# Patient Record
Sex: Female | Born: 1975 | Race: Black or African American | Hispanic: No | Smoking: Never smoker
Health system: Southern US, Community
[De-identification: ages and names within clinical notes are randomized; demographics above are authoritative.]

## PROBLEM LIST (undated history)

## (undated) DIAGNOSIS — G43909 Migraine, unspecified, not intractable, without status migrainosus: Secondary | ICD-10-CM

## (undated) DIAGNOSIS — J45909 Unspecified asthma, uncomplicated: Secondary | ICD-10-CM

## (undated) DIAGNOSIS — D219 Benign neoplasm of connective and other soft tissue, unspecified: Secondary | ICD-10-CM

## (undated) DIAGNOSIS — R5382 Chronic fatigue, unspecified: Secondary | ICD-10-CM

## (undated) DIAGNOSIS — I10 Essential (primary) hypertension: Secondary | ICD-10-CM

## (undated) DIAGNOSIS — E039 Hypothyroidism, unspecified: Secondary | ICD-10-CM

## (undated) DIAGNOSIS — M549 Dorsalgia, unspecified: Secondary | ICD-10-CM

## (undated) DIAGNOSIS — K589 Irritable bowel syndrome without diarrhea: Secondary | ICD-10-CM

## (undated) DIAGNOSIS — G932 Benign intracranial hypertension: Secondary | ICD-10-CM

## (undated) DIAGNOSIS — Z862 Personal history of diseases of the blood and blood-forming organs and certain disorders involving the immune mechanism: Secondary | ICD-10-CM

## (undated) DIAGNOSIS — K219 Gastro-esophageal reflux disease without esophagitis: Secondary | ICD-10-CM

## (undated) DIAGNOSIS — R87619 Unspecified abnormal cytological findings in specimens from cervix uteri: Secondary | ICD-10-CM

## (undated) DIAGNOSIS — M1990 Unspecified osteoarthritis, unspecified site: Secondary | ICD-10-CM

## (undated) DIAGNOSIS — I82409 Acute embolism and thrombosis of unspecified deep veins of unspecified lower extremity: Secondary | ICD-10-CM

## (undated) DIAGNOSIS — D573 Sickle-cell trait: Secondary | ICD-10-CM

## (undated) DIAGNOSIS — IMO0002 Reserved for concepts with insufficient information to code with codable children: Secondary | ICD-10-CM

## (undated) DIAGNOSIS — Z9889 Other specified postprocedural states: Secondary | ICD-10-CM

## (undated) HISTORY — DX: Benign neoplasm of connective and other soft tissue, unspecified: D21.9

## (undated) HISTORY — PX: LEEP: SHX91

## (undated) HISTORY — DX: Migraine, unspecified, not intractable, without status migrainosus: G43.909

## (undated) HISTORY — PX: TOE SURGERY: SHX1073

## (undated) HISTORY — DX: Unspecified osteoarthritis, unspecified site: M19.90

## (undated) HISTORY — DX: Unspecified asthma, uncomplicated: J45.909

## (undated) HISTORY — DX: Irritable bowel syndrome, unspecified: K58.9

## (undated) HISTORY — DX: Gastro-esophageal reflux disease without esophagitis: K21.9

## (undated) HISTORY — DX: Benign intracranial hypertension: G93.2

## (undated) HISTORY — DX: Hypothyroidism, unspecified: E03.9

## (undated) HISTORY — DX: Dorsalgia, unspecified: M54.9

## (undated) HISTORY — DX: Reserved for concepts with insufficient information to code with codable children: IMO0002

## (undated) HISTORY — DX: Other specified postprocedural states: Z98.890

## (undated) HISTORY — DX: Unspecified abnormal cytological findings in specimens from cervix uteri: R87.619

## (undated) HISTORY — DX: Essential (primary) hypertension: I10

---

## 2000-05-12 ENCOUNTER — Ambulatory Visit (HOSPITAL_COMMUNITY): Admission: RE | Admit: 2000-05-12 | Discharge: 2000-05-12 | Payer: Self-pay | Admitting: Internal Medicine

## 2000-05-13 ENCOUNTER — Encounter: Payer: Self-pay | Admitting: Internal Medicine

## 2002-07-08 ENCOUNTER — Encounter: Payer: Self-pay | Admitting: Family Medicine

## 2002-07-08 ENCOUNTER — Ambulatory Visit (HOSPITAL_COMMUNITY): Admission: RE | Admit: 2002-07-08 | Discharge: 2002-07-08 | Payer: Self-pay | Admitting: Family Medicine

## 2002-12-08 ENCOUNTER — Encounter (HOSPITAL_COMMUNITY): Admission: RE | Admit: 2002-12-08 | Discharge: 2003-01-07 | Payer: Self-pay | Admitting: Internal Medicine

## 2002-12-08 ENCOUNTER — Encounter: Payer: Self-pay | Admitting: Internal Medicine

## 2002-12-24 ENCOUNTER — Encounter: Payer: Self-pay | Admitting: Internal Medicine

## 2002-12-24 ENCOUNTER — Ambulatory Visit (HOSPITAL_COMMUNITY): Admission: RE | Admit: 2002-12-24 | Discharge: 2002-12-24 | Payer: Self-pay | Admitting: Internal Medicine

## 2002-12-30 ENCOUNTER — Encounter: Payer: Self-pay | Admitting: Internal Medicine

## 2003-11-04 ENCOUNTER — Ambulatory Visit (HOSPITAL_COMMUNITY): Admission: RE | Admit: 2003-11-04 | Discharge: 2003-11-04 | Payer: Self-pay | Admitting: Family Medicine

## 2003-12-15 ENCOUNTER — Ambulatory Visit (HOSPITAL_COMMUNITY): Admission: RE | Admit: 2003-12-15 | Discharge: 2003-12-15 | Payer: Self-pay | Admitting: Orthopedic Surgery

## 2004-02-17 ENCOUNTER — Ambulatory Visit (HOSPITAL_COMMUNITY): Admission: RE | Admit: 2004-02-17 | Discharge: 2004-02-17 | Payer: Self-pay | Admitting: Internal Medicine

## 2004-05-09 ENCOUNTER — Ambulatory Visit (HOSPITAL_COMMUNITY): Admission: RE | Admit: 2004-05-09 | Discharge: 2004-05-09 | Payer: Self-pay | Admitting: Internal Medicine

## 2004-10-03 ENCOUNTER — Ambulatory Visit (HOSPITAL_COMMUNITY): Admission: RE | Admit: 2004-10-03 | Discharge: 2004-10-03 | Payer: Self-pay | Admitting: Internal Medicine

## 2004-11-01 ENCOUNTER — Ambulatory Visit: Payer: Self-pay | Admitting: Orthopedic Surgery

## 2004-11-12 ENCOUNTER — Ambulatory Visit: Payer: Self-pay | Admitting: Orthopedic Surgery

## 2004-11-13 ENCOUNTER — Encounter (HOSPITAL_COMMUNITY): Admission: RE | Admit: 2004-11-13 | Discharge: 2004-12-13 | Payer: Self-pay | Admitting: Orthopedic Surgery

## 2004-12-03 ENCOUNTER — Ambulatory Visit: Payer: Self-pay | Admitting: Orthopedic Surgery

## 2005-05-12 ENCOUNTER — Emergency Department (HOSPITAL_COMMUNITY): Admission: EM | Admit: 2005-05-12 | Discharge: 2005-05-13 | Payer: Self-pay | Admitting: Emergency Medicine

## 2006-02-21 ENCOUNTER — Ambulatory Visit (HOSPITAL_COMMUNITY): Admission: RE | Admit: 2006-02-21 | Discharge: 2006-02-21 | Payer: Self-pay | Admitting: Internal Medicine

## 2008-11-05 ENCOUNTER — Emergency Department (HOSPITAL_COMMUNITY): Admission: EM | Admit: 2008-11-05 | Discharge: 2008-11-05 | Payer: Self-pay | Admitting: Emergency Medicine

## 2008-12-15 ENCOUNTER — Emergency Department (HOSPITAL_COMMUNITY): Admission: EM | Admit: 2008-12-15 | Discharge: 2008-12-15 | Payer: Self-pay | Admitting: Emergency Medicine

## 2008-12-19 ENCOUNTER — Emergency Department (HOSPITAL_COMMUNITY): Admission: EM | Admit: 2008-12-19 | Discharge: 2008-12-19 | Payer: Self-pay | Admitting: Emergency Medicine

## 2009-10-12 ENCOUNTER — Emergency Department (HOSPITAL_COMMUNITY): Admission: EM | Admit: 2009-10-12 | Discharge: 2009-10-12 | Payer: Self-pay | Admitting: Emergency Medicine

## 2010-04-11 ENCOUNTER — Encounter (INDEPENDENT_AMBULATORY_CARE_PROVIDER_SITE_OTHER): Payer: Self-pay | Admitting: *Deleted

## 2010-05-03 ENCOUNTER — Ambulatory Visit
Admission: RE | Admit: 2010-05-03 | Discharge: 2010-05-03 | Payer: Self-pay | Source: Home / Self Care | Attending: Gastroenterology | Admitting: Gastroenterology

## 2010-05-03 DIAGNOSIS — K219 Gastro-esophageal reflux disease without esophagitis: Secondary | ICD-10-CM | POA: Insufficient documentation

## 2010-05-03 DIAGNOSIS — K5900 Constipation, unspecified: Secondary | ICD-10-CM | POA: Insufficient documentation

## 2010-05-08 ENCOUNTER — Encounter: Payer: Self-pay | Admitting: Internal Medicine

## 2010-05-15 ENCOUNTER — Encounter: Payer: Self-pay | Admitting: Gastroenterology

## 2010-05-18 ENCOUNTER — Encounter (INDEPENDENT_AMBULATORY_CARE_PROVIDER_SITE_OTHER): Payer: Self-pay

## 2010-05-18 LAB — CONVERTED CEMR LAB
Eosinophils Relative: 1 % (ref 0–5)
HCT: 35.7 % — ABNORMAL LOW (ref 36.0–46.0)
Hemoglobin: 12.2 g/dL (ref 12.0–15.0)
Lymphocytes Relative: 33 % (ref 12–46)
Lymphs Abs: 2.7 10*3/uL (ref 0.7–4.0)
Monocytes Absolute: 0.6 10*3/uL (ref 0.1–1.0)
Monocytes Relative: 8 % (ref 3–12)
RBC: 4.26 M/uL (ref 3.87–5.11)
WBC: 8.2 10*3/uL (ref 4.0–10.5)

## 2010-05-22 ENCOUNTER — Encounter: Payer: Self-pay | Admitting: Internal Medicine

## 2010-05-31 NOTE — Letter (Signed)
Summary: Normal Results Letter  [facility]  [address]   [place], [place] [address]   Phone: [phone]  Fax: [phone]    May 18, 2010  Sara Pruitt [address] [place], [place]  [address] 1976-01-30   Dear Ms. Bowdoin,   Our office has been trying to contact you.  We just wanted to inform you that all of your labs were normal. Gerrit Halls NP would like for you to schedule a colonoscopy, please call the office at [phone] to schedule.   Thank you,    Hendricks Limes, LPN Cloria Spring, LPN  [facility] Ph: [phone]   Fax: [phone]

## 2010-05-31 NOTE — Letter (Signed)
Summary: Unable to Reach, Consult Scheduled  [facility]  [address]   [place], [place] [address]   Phone: [phone]  Fax: [phone]    04/11/2010  Sara Pruitt [address] [place], [place]  [address] [birth year]   Dear [name],   At the recommendation of DR [facility]  we have been asked to schedule you a consult with DR FIELDS for CHRONIC CONSTIPATION WITH SEVERE HALITOSIS. Please call our office at [phone].     Thank you,    Diana Eves  [facility] R. Roetta Sessions, M.D.    Jonette Eva, M.D. Lorenza Burton, FNP-BC    Tana Coast, PA-C Phone: [phone]    Fax: [phone]

## 2010-05-31 NOTE — Letter (Signed)
Summary: TCS ORDER  TCS ORDER   Imported By: Ave Filter 05/22/2010 11:28:22  _____________________________________________________________________  External Attachment:    Type:   Image     Comment:   External Document

## 2010-05-31 NOTE — Letter (Signed)
Summary: REFERRAL FROM [facility] MEDICAL  REFERRAL FROM [facility] MEDICAL   Imported By: Rexene Alberts 05/08/2010 14:19:47  _____________________________________________________________________  External Attachment:    Type:   Image     Comment:   External Document

## 2010-05-31 NOTE — Assessment & Plan Note (Signed)
Summary: chronic constipation with severe halitosis/ss   Visit Type:  Initial Consult Referring Provider:  Sherwood Gambler Primary Care Provider:  Sherwood Gambler  CC:  constipation.  History of Present Illness: Sara Pruitt is a pleasant 35 year old African American female who presents today for evaluation for chronic constipation. Also reports has been told she has bad breath by her mom and son. Diagnosed with IBS-C approximately 3 years ago by PCP. Denies abdominal pain, denies N/V. +straining with BM. Reports averaging about 2 BMs per month. Went 3 mos without going to bathroom prior to New Years. Drinks at least 6-8 glasses of water per day. Has not seen brbpr, but does report burgundy looking stool at times. No fiber supplements. Doesn't take anything for constipation. Had been taking mineral oil at one point, but stopped. Doesn't like having to take anything. Does report occasional hard stools. Reports reflux, X 10 years, since child born, associated with wt gain. Had been in size  3 or 4 prior to pregnancy. Now weighs 197. Reports steadily gaining weight. No loss of appetite. First-degree relative with hx of colon ca age 74 (mom). No prior colonoscopy.  Current Medications (verified): 1)  Mirena 20 Mcg/24hr Iud (Levonorgestrel) .... As Directed 2)  Lisinopril 10 Mg Tabs (Lisinopril) .... Take 1 Tablet By Mouth Once A Day 3)  Multi-Vitamin .... One Tablet Daily  Allergies (verified): 1)  ! 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  Alcohol Use - no Illicit Drug Use - no Smoking  Status:  never Drug Use:  no  Review of Systems General:  Denies fever, chills, and anorexia. Eyes:  Denies blurring, irritation, and discharge. ENT:  Denies sore throat, hoarseness, and difficulty swallowing. CV:  Denies chest pains, syncope, and dyspnea on exertion. Resp:  Denies dyspnea at rest, cough, and wheezing. GI:  Complains of indigestion/heartburn and constipation; denies difficulty swallowing, pain on swallowing, nausea, vomiting, abdominal pain, gas/bloating, change in bowel habits, black BMs, and fecal incontinence. GU:  Denies urinary burning and urinary frequency. MS:  Denies joint pain / LOM, joint swelling, and joint stiffness. Derm:  Denies rash, itching, and dry skin. Neuro:  Denies weakness and syncope. Psych:  Denies depression and anxiety. Endo:  Denies cold intolerance and heat intolerance. Heme:  Denies bruising and bleeding.  Vital Signs:  Patient profile:   35 year old female Height:      67 inches Weight:      197 pounds BMI:     30.97 Temp:     98.9 degrees F oral Pulse rate:   60 / minute BP sitting:   120 / 90  (left arm) Cuff size:   large  Vitals Entered By: Sara Spring LPN (May 03, 2010 2:14 PM)  Physical Exam  General:  Well developed, well nourished, no acute distress.obese.   Head:  Normocephalic and atraumatic. Eyes:  sclera without icterus Mouth:  No deformity or lesions, dentition normal. Neck:  Supple; no masses or thyromegaly. Lungs:  Clear throughout to auscultation. Heart:  Regular rate and rhythm; no murmurs, rubs,  or bruits. Abdomen:  normal bowel sounds, obese,  without guarding, without rebound, no distesion, no tenderness, no masses, and no hepatomegally or splenomegaly.   Msk:  Symmetrical with no gross deformities. Normal posture. Pulses:  Normal pulses noted. Extremities:  No clubbing, cyanosis, edema or deformities noted. Neurologic:  Alert and  oriented x4;  grossly normal neurologically. Skin:  Intact without  significant lesions or rashes. Psych:  Alert and cooperative. Normal mood and affect.  Impression & Recommendations:  Problem # 1:  CONSTIPATION (ICD-40.1) 35 year old African-American female with hx of chronic constipation, usually averaging 2 stools/month. Prior to New Years, reportedly had not had BM X 3 mos. Denies abdominal pain, N/V. Does report hx of intermittent burgundy stools, mom diagnosed with colon ca age 58. Pt does not take any fiber supplements or agents for constipation. No recent labs available. Doubt malignant process, but due to family hx and reports of burgundy-colored stools, would benefit from screening colonoscopy. In the interim, will start on bowel regimen and assess for anemia, endocrine factors. If prescribed bowel regimen does not alleviate constipation and nothing is found on colonoscopy, pt may likely benefit from [place].  CBC, TSH Fiber of choice daily Probiotic daily Miralax daily as needed constipation  colace twice/day. TCS with Dr. Jena Gauss in near future: the risks, benefits, and alternatives have been discussed in detail with the pt; verbal consent obtained.  Orders: T-CBC w/Diff ([id number]) T-TSH [id number]) Consultation Level III (40102)  Problem # 2:  GERD (ICD-530.81)  hx of GERD since son was born, (11 years). Has had steady weight gain since then, likely a conributing factor to symptoms. Not currently on PPI, no warning signs or red flags.  Wt loss recommended (1-2 lbs per week) Prilosec 20 mg by mouth daily  Orders: Consultation Level III (72536)  Patient Instructions: 1)  Fiber supplement daily (Metamucil, Benefiber) 2)  Colace twice a day, hold if diarrhea 3)  Miralax daily for constipation, hold if diarrhea 4)  Probiotic daily (Align, Restora) 5)  Will discuss need for colonoscopy  6)  Prilosec daily, 30 minutes before meals 7)  The medication list was reviewed and reconciled.  All changed / newly prescribed medications were  explained.  A complete medication list was provided to the patient / caregiver.  Prescriptions: PRILOSEC 20 MG CPDR (OMEPRAZOLE) take 1 30 minutes before breakfast by mouth daily  #31 x 3   Entered and Authorized by:   Gerrit Halls NP   Signed by:   Gerrit Halls NP on 05/03/2010   Method used:   Faxed to ...       [facility] (retail)       [address]       [place], [place]  [address]       Ph: [phone]       Fax: [phone]   RxID:   [id number]

## 2010-06-05 ENCOUNTER — Ambulatory Visit (HOSPITAL_COMMUNITY)
Admission: RE | Admit: 2010-06-05 | Discharge: 2010-06-05 | Disposition: A | Discharge status: Home / Self Care | Payer: Medicaid Other | Source: Ambulatory Visit | Attending: Internal Medicine | Admitting: Internal Medicine

## 2010-06-05 ENCOUNTER — Encounter: Payer: Medicaid Other | Admitting: Internal Medicine

## 2010-06-05 DIAGNOSIS — K5900 Constipation, unspecified: Secondary | ICD-10-CM

## 2010-06-05 DIAGNOSIS — Z79899 Other long term (current) drug therapy: Secondary | ICD-10-CM | POA: Insufficient documentation

## 2010-06-05 DIAGNOSIS — K5909 Other constipation: Secondary | ICD-10-CM | POA: Insufficient documentation

## 2010-06-05 DIAGNOSIS — Z800 Family history of malignant neoplasm of digestive organs: Secondary | ICD-10-CM | POA: Insufficient documentation

## 2010-06-05 DIAGNOSIS — I10 Essential (primary) hypertension: Secondary | ICD-10-CM | POA: Insufficient documentation

## 2010-06-07 DIAGNOSIS — Z9889 Other specified postprocedural states: Secondary | ICD-10-CM

## 2010-06-07 HISTORY — DX: Other specified postprocedural states: Z98.890

## 2010-06-19 NOTE — Op Note (Signed)
  NAME:  Sara Pruitt, Sara Pruitt          ACCOUNT NO.:  [id number]  MEDICAL RECORD NO.:  [id number]           PATIENT TYPE:  O  LOCATION:  [facility]YP                          FACILITY:  [facility]  PHYSICIAN:  R. Roetta Sessions, M.D. DATE OF BIRTH:  08-04-1975  DATE OF PROCEDURE:  06/05/2010 DATE OF DISCHARGE:                              OPERATIVE REPORT   PROCEDURE:  Ileal colonoscopy.  INDICATIONS FOR PROCEDURE:  A 35 year old lady with significant refractory chronic constipation.  No improvement with MiraLax recently, mangled a month for she has a bowel movement, positive family history colon cancer, first-degree relative at a young age.  Colonoscopy is now being done.  Risks, benefits, limitations, alternatives, imponderables have been discussed, questions answered.  Please see the documentation in the medical record.  PROCEDURE NOTE:  O2 saturation, blood pressure, pulse, respirations were monitored throughout the entirety of the procedure. CONSCIOUS SEDATION:  Versed 4 mg IV, Demerol 75 mg IV in divided doses.  INSTRUMENT:  Pentax video chip system.  FINDINGS:  Digital rectal exam revealed no abnormalities.  Endoscopic findings:  Prep was adequate.  Colon:  Colonic mucosa was surveyed from the rectosigmoid junction through the left transverse right colon to the appendiceal orifice, ileocecal valve/cecum.  These structures were well seen and photographed for the record.  Terminal ileum was intubated to 5 cm.  From this level, scope was slowly and cautiously withdrawn.  All previously mentioned mucosal surfaces were again seen.  The patient had somewhat of an elongated tortuous colon, otherwise colonic mucosa appeared normal as did terminal ileal mucosa.  Scope was pulled down to the rectum where a thorough examination of rectal mucosa including retroflexed view of the anal verge demonstrated no abnormalities.  The patient tolerated the procedure well.  Cecal withdrawal time 9  minutes.  IMPRESSION:  Normal rectum, colon, and terminal ileum.  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  RECOMMENDATIONS: 1. We will proceed with the course of Amitiza 8 mcg gelcap, beginning     with one gelcap during the breakfast daily for 5 days, and increase     to 8 mcg twice daily during meals, i.e. breakfast and supper.     Thereafter, the     patient was admonished about adequate birth control and avoiding     pregnancy on this regimen.  Also she was admonished about the side     effects including headache, nausea, and diarrhea. 2. We will arrange followup appointment with [place] in the office in 1     month.     Jonathon Bellows, M.D.     RMR/MEDQ  D:  06/05/2010  T:  06/05/2010  Job:  [id number]  cc:   Madelin Rear. Sherwood Gambler, MD Fax: [phone]  Electronically Signed by Lorrin Goodell M.D. on 06/19/2010 02:32:53 PM

## 2010-06-29 ENCOUNTER — Encounter: Payer: Self-pay | Admitting: Gastroenterology

## 2010-07-03 ENCOUNTER — Ambulatory Visit (INDEPENDENT_AMBULATORY_CARE_PROVIDER_SITE_OTHER): Payer: Medicaid Other | Admitting: Gastroenterology

## 2010-07-03 ENCOUNTER — Encounter: Payer: Self-pay | Admitting: Gastroenterology

## 2010-07-03 DIAGNOSIS — K219 Gastro-esophageal reflux disease without esophagitis: Secondary | ICD-10-CM

## 2010-07-03 DIAGNOSIS — K5900 Constipation, unspecified: Secondary | ICD-10-CM

## 2010-07-10 NOTE — Assessment & Plan Note (Signed)
Summary: 4 WK F/U/LAW   Vital Signs:  Patient profile:   35 year old female Height:      69 inches Weight:      200.75 pounds BMI:     29.75 Temp:     98.2 degrees F oral Pulse rate:   60 / minute BP sitting:   126 / 86  (left arm)  Vitals Entered By: Carolan Clines LPN (July 02, 452 10:29 AM)  Visit Type:  Follow-up Visit Primary Care Provider:  Sherwood Gambler   History of Present Illness: Sara Pruitt is a 35 year old African-American female who presents today in f/u for constipation. Was started on Amitiza twice/day after colonoscopy 06/05/10. .Taking fiber twice/day. Occasional cramping prior to BM, immediately relieved.  Has a BM at least once every day now. No melena or brbpr. Not taking Prilosec every day, no difficulty with reflux. States bad breath is better. Less bloating. Trying to eat yogurt.    2/7/12Normal rectum, colon, and terminal ileum.  I suspect functional constipation,   Current Medications (verified): 1)  Mirena 20 Mcg/24hr Iud (Levonorgestrel) .... As Directed 2)  Amitiza 8 Mcg Caps (Lubiprostone) .... Take One At Breakfast and One At Supper 3)  Benefiber  Powd (Wheat Dextrin) .... Take Two Times A Day  Allergies: 1)  ! 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 Eyes:  Denies blurring, irritation, and discharge. ENT:  Denies sore throat, hoarseness, and difficulty swallowing. CV:  Denies chest pains and syncope. Resp:  Denies dyspnea at rest and wheezing. GI:  See HPI. GU:  Denies urinary burning and urinary frequency. MS:  Denies joint pain / LOM, joint swelling, and joint stiffness. Derm:  Denies rash, itching, and dry skin. Neuro:  Denies weakness and syncope. Psych:  Denies depression and anxiety.  Physical Exam  General:  Well developed, well nourished, no acute distress. Eyes:  PERRLA, no  icterus. Abdomen:  +BS, soft, non-tender, non-distended. no HSM, no rebound or guarding.  Msk:  Symmetrical with no gross deformities. Normal posture. Neurologic:  Alert and  oriented x4;  grossly normal neurologically. Skin:  Intact without significant lesions or rashes. Psych:  Alert and cooperative. Normal mood and affect.   Impression & Recommendations:  Problem # 1:  CONSTIPATION (ICD-31.32)  35 year old female s/p colonoscopy 05/2010: functional constipation. Started on Amitiza, doing well. Fiber daily. No melena or brbpr.   Continue Amitiza two times a day Continue fiber Start probiotic: samples given Return in 6 mos  Orders: Est. Patient Level II (09811)  Problem # 2:  GERD (ICD-530.81)  No issues currently. Takes as needed. No changes   Return in 6 mos  Orders: Est. Patient Level II (91478) Prescriptions: AMITIZA 8 MCG CAPS (LUBIPROSTONE) take 1 by mouth with meal in morning, 1 by mouth with meal in evening. avoid pregnancy  #62 x 5   Entered and Authorized by:   Gerrit Halls NP   Signed by:   Gerrit Halls NP on 07/03/2010   Method used:   Faxed to ...       [facility] (retail)       [address]       [place], [place]  [address]       Ph: [phone]       Fax: [phone]   RxID:  [id number]    Orders Added: 1)  Est. Patient Level II [96295]  Appended Document: 4 WK F/U/LAW 6 MONTH F/U OPV IS IN THE COMPUTER

## 2010-07-15 LAB — BASIC METABOLIC PANEL
BUN: 7 mg/dL (ref 6–23)
Chloride: 106 mEq/L (ref 96–112)
Glucose, Bld: 92 mg/dL (ref 70–99)
Potassium: 3.5 mEq/L (ref 3.5–5.1)

## 2010-07-20 ENCOUNTER — Emergency Department (HOSPITAL_COMMUNITY): Payer: Medicaid Other

## 2010-07-20 ENCOUNTER — Emergency Department (HOSPITAL_COMMUNITY)
Admission: EM | Admit: 2010-07-20 | Discharge: 2010-07-20 | Disposition: A | Discharge status: Home / Self Care | Payer: Medicaid Other | Attending: Emergency Medicine | Admitting: Emergency Medicine

## 2010-07-20 DIAGNOSIS — R110 Nausea: Secondary | ICD-10-CM | POA: Insufficient documentation

## 2010-07-20 DIAGNOSIS — R109 Unspecified abdominal pain: Secondary | ICD-10-CM | POA: Insufficient documentation

## 2010-07-20 DIAGNOSIS — K560 Paralytic ileus: Secondary | ICD-10-CM | POA: Insufficient documentation

## 2010-07-20 DIAGNOSIS — R197 Diarrhea, unspecified: Secondary | ICD-10-CM | POA: Insufficient documentation

## 2010-07-20 LAB — COMPREHENSIVE METABOLIC PANEL
Albumin: 3.8 g/dL (ref 3.5–5.2)
BUN: 11 mg/dL (ref 6–23)
Chloride: 106 mEq/L (ref 96–112)
Creatinine, Ser: 0.92 mg/dL (ref 0.4–1.2)
Glucose, Bld: 107 mg/dL — ABNORMAL HIGH (ref 70–99)
Total Bilirubin: 0.4 mg/dL (ref 0.3–1.2)

## 2010-07-20 LAB — URINALYSIS, ROUTINE W REFLEX MICROSCOPIC
Bilirubin Urine: NEGATIVE
Ketones, ur: NEGATIVE mg/dL
Leukocytes, UA: NEGATIVE
Nitrite: NEGATIVE
Protein, ur: NEGATIVE mg/dL
Urobilinogen, UA: 0.2 mg/dL (ref 0.0–1.0)
pH: 5.5 (ref 5.0–8.0)

## 2010-07-20 LAB — DIFFERENTIAL
Eosinophils Absolute: 0 10*3/uL (ref 0.0–0.7)
Eosinophils Relative: 0 % (ref 0–5)
Lymphs Abs: 1.5 10*3/uL (ref 0.7–4.0)
Monocytes Relative: 5 % (ref 3–12)

## 2010-07-20 LAB — CBC
HCT: 38.9 % (ref 36.0–46.0)
MCH: 29.6 pg (ref 26.0–34.0)
MCV: 84 fL (ref 78.0–100.0)
Platelets: 300 10*3/uL (ref 150–400)
RBC: 4.63 MIL/uL (ref 3.87–5.11)
RDW: 13.2 % (ref 11.5–15.5)

## 2010-07-20 LAB — PREGNANCY, URINE: Preg Test, Ur: NEGATIVE

## 2010-07-20 LAB — LIPASE, BLOOD: Lipase: 28 U/L (ref 11–59)

## 2010-07-26 ENCOUNTER — Encounter: Payer: Self-pay | Admitting: Gastroenterology

## 2010-07-30 ENCOUNTER — Encounter: Payer: Self-pay | Admitting: Gastroenterology

## 2010-07-31 ENCOUNTER — Encounter: Payer: Self-pay | Admitting: Gastroenterology

## 2010-07-31 ENCOUNTER — Ambulatory Visit (INDEPENDENT_AMBULATORY_CARE_PROVIDER_SITE_OTHER): Payer: Medicaid Other | Admitting: Gastroenterology

## 2010-07-31 VITALS — BP 138/91 | HR 66 | Temp 98.2°F | Ht 67.0 in | Wt 195.5 lb

## 2010-07-31 DIAGNOSIS — K5900 Constipation, unspecified: Secondary | ICD-10-CM

## 2010-07-31 NOTE — Patient Instructions (Signed)
1. We are increasing the Amitiza dose. Watch for headaches, nausea, abdominal pain. Begin by taking Amitiza 24 mcg each evening. Increase to Amitiza 24 mcg in morning and at night after 3 days. If stools become loose, call our office. We will need to adjust the dose. USE birth control while on this medicine.   2. Supplemental fiber daily.  3. Begin probiotic.  4. We will see you back in 6 weeks.

## 2010-08-02 ENCOUNTER — Encounter: Payer: Self-pay | Admitting: Gastroenterology

## 2010-08-02 NOTE — Progress Notes (Signed)
Referring Provider: Cassell Smiles., MD Primary Care Physician:  Cassell Smiles., MD  Chief Complaint  Patient presents with  . Abdominal Pain    recent er visit    HPI:   Sara Pruitt is a pleasant 35 year old female with hx of chronic constipation; recently underwent colonoscopy in February that was normal. Actually presented to ED March 23rd secondary to abdominal pain, +nausea. AAS was performed with findings of questionable focal ileus. Pt states she was bloated from epigastrum to umbilicus; pain 10/10, felt like labor. After leaving, finally able to have BM. Had large amount. Felt relief. Denies N/V currently. Only complaint is of bloating. Denies abdominal pain. +flatus. No signs of obstruction. Afebrile. On Amitiza 8 mcg bid. No probiotic.   Past Medical History  Diagnosis Date  . Constipation     ? IBS  . GERD (gastroesophageal reflux disease)   . HTN (hypertension)   . S/P colonoscopy June 07, 2010    Dr. Jena Gauss: secondary to constipation, normal. 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 levonorgestrel (MIRENA) 20 MCG/24HR IUD 1 each by Intrauterine route once.        Marland Kitchen lisinopril (PRINIVIL,ZESTRIL) 10 MG tablet Take 10 mg by mouth daily.        Marland Kitchen lubiprostone (AMITIZA) 8 MCG capsule Take 8 mcg by mouth. Take one at breakfast and one at supper       . Multiple Vitamin (MULTIVITAMIN PO) Take by mouth daily.        Marland Kitchen omeprazole (PRILOSEC) 20 MG capsule Take 20 mg by mouth daily. TAKE ONE 30 MINUTES BEFORE BREAKFAST BY MOUTH DAILY       . Wheat Dextrin (BENEFIBER) POWD Take by mouth. Take two times a day         Allergies as of 07/31/2010 - Review Complete 07/31/2010  Allergen Reaction Noted  . Sulfonamide derivatives     Review of Systems: Gen: Denies fever, chills, anorexia. Denies fatigue, weakness, weight loss.  CV:  Denies chest pain, palpitations, syncope, peripheral edema, and claudication. Resp: Denies dyspnea at rest, cough, wheezing, coughing up blood, and pleurisy. GI: See HPI Derm: Denies rash, itching, dry skin Psych: Denies depression, anxiety, memory loss, confusion. No homicidal or suicidal ideation.  Heme: Denies bruising, bleeding, and enlarged lymph nodes.  Physical Exam: BP 138/91  Pulse 66  Temp 98.2 F (36.8 C)  Ht 5\' 7"  (1.702 m)  Wt 195 lb 8 oz (88.678 kg)  BMI 30.62 kg/m2  SpO2 100% General:   Alert and oriented. No distress noted. Pleasant and cooperative.  Head:  Normocephalic and atraumatic. Eyes:  Conjuctiva clear without scleral icterus. Mouth:  Oral mucosa pink and moist. Good dentition. No lesions. Neck:  Supple, without mass or thyromegaly. Heart:  S1, S2 present without murmurs, rubs, or gallops. Regular rate and rhythm. Abdomen:  +BS, soft, non-tender and non-distended. No rebound or guarding. No HSM or masses noted. Msk:  Symmetrical without gross deformities. Normal posture. Extremities:  Without edema. Neurologic:  Alert and  oriented x4;  grossly normal neurologically. Skin:  Intact without significant lesions or rashes. Psych:  Alert and cooperative. Normal mood and affect.

## 2010-08-02 NOTE — Assessment & Plan Note (Signed)
35 year old pleasant female with hx of chronic constipation. Colonoscopy in 05/2010 reassuring. Late March presented to ED due to severe constipation. AAS findings with questionable focal ileus. Pt presents today with no signs of obstruction, no N/V. Only mild bloating. On Amitiza twice/day. May benefit from increasing dosage slowly, add probiotic, continue benefiber. Pt counseled heavily on signs/symptoms to watch for concerning Amitiza.   Amitiza 24 mcg, take one daily X 3 days, then twice/day. Watch for N/V, abdominal pain, abdominal distension. AVOID pregnancy. Use birth control methods. Samples provided. Pt to call is this assists with chronic constipation.   Continue benefiber  Probiotic daily; samples provided.  Return in 6 weeks for reassessment.

## 2010-08-06 ENCOUNTER — Telehealth: Payer: Self-pay

## 2010-08-06 NOTE — Telephone Encounter (Signed)
If pt is taking BID, decrease to once per day. If she is already taking only once/day, return back to original dosing of BID. Needs to ensure taking a probiotic daily, benefiber daily. Drink 6-8 glasses of water daily. Exercise most days of the week.

## 2010-08-06 NOTE — Telephone Encounter (Signed)
Tried to call pt- LMOM 

## 2010-08-06 NOTE — Telephone Encounter (Signed)
Pt called- left voicemail- stated the medication (Amitiza) [name] put her on has caused her to have loose stools. Pt stopped taking it and wants to know how she should adjust the meds now. Please advise

## 2010-08-07 NOTE — Telephone Encounter (Signed)
Pt is taking bid. Advised her to take it qd and continue probiotics. Pt has not started the benefiber yet. She will call if no response

## 2010-08-09 ENCOUNTER — Other Ambulatory Visit (HOSPITAL_COMMUNITY): Payer: Self-pay | Admitting: Internal Medicine

## 2010-08-09 DIAGNOSIS — M545 Low back pain, unspecified: Secondary | ICD-10-CM

## 2010-08-16 ENCOUNTER — Ambulatory Visit (HOSPITAL_COMMUNITY)
Admission: RE | Admit: 2010-08-16 | Discharge: 2010-08-16 | Disposition: A | Discharge status: Home / Self Care | Payer: Medicaid Other | Source: Ambulatory Visit | Attending: Internal Medicine | Admitting: Internal Medicine

## 2010-08-16 DIAGNOSIS — M545 Low back pain, unspecified: Secondary | ICD-10-CM | POA: Insufficient documentation

## 2010-08-16 DIAGNOSIS — M51379 Other intervertebral disc degeneration, lumbosacral region without mention of lumbar back pain or lower extremity pain: Secondary | ICD-10-CM | POA: Insufficient documentation

## 2010-08-16 DIAGNOSIS — M5137 Other intervertebral disc degeneration, lumbosacral region: Secondary | ICD-10-CM | POA: Insufficient documentation

## 2010-09-11 ENCOUNTER — Encounter: Payer: Self-pay | Admitting: Gastroenterology

## 2010-09-11 ENCOUNTER — Ambulatory Visit (INDEPENDENT_AMBULATORY_CARE_PROVIDER_SITE_OTHER): Payer: Medicaid Other | Admitting: Gastroenterology

## 2010-09-11 VITALS — BP 124/77 | HR 60 | Temp 97.4°F | Ht 67.0 in | Wt 197.2 lb

## 2010-09-11 DIAGNOSIS — K5900 Constipation, unspecified: Secondary | ICD-10-CM

## 2010-09-11 NOTE — Progress Notes (Signed)
Referring Provider: Cassell Smiles., MD Primary Care Physician:  Cassell Smiles., MD Primary Gastroenterologist: Dr. Jena Gauss   Chief Complaint  Patient presents with  . Follow-up    HPI:   Pt returns in f/u for chronic constipation. At last visit, we increased Amitiza to 24 mcg BID. She was actually unable to tolerate just once/day dosing of 24 mcg. Noted immediate diarrhea.   Averaging BM once/week. Only if takes in lactose-related foods. Cheese doesn't bother her. Will eat ice cream or frozen yogurt. Stool is soft. Taking benefiber. Amitiza 8 mcg BID, not on miralax. Doesn't feel uncomfortable. No abdominal pain, no N/V.   Past Medical History  Diagnosis Date  . Constipation     ? IBS  . GERD (gastroesophageal reflux disease)   . HTN (hypertension)   . S/P colonoscopy June 07, 2010    Dr. Jena Gauss: secondary to constipation, normal. 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 levonorgestrel (MIRENA) 20 MCG/24HR IUD 1 each by Intrauterine route once.        Marland Kitchen lisinopril (PRINIVIL,ZESTRIL) 10 MG tablet Take 10 mg by mouth daily.        Marland Kitchen lubiprostone (AMITIZA) 8 MCG capsule Take 8 mcg by mouth. Take one at breakfast and one at supper       . Multiple Vitamin (MULTIVITAMIN PO) Take by mouth daily.        Marland Kitchen omeprazole (PRILOSEC) 20 MG capsule Take 20 mg by mouth daily. TAKE ONE 30 MINUTES BEFORE BREAKFAST BY MOUTH DAILY       . Wheat Dextrin (BENEFIBER) POWD Take by mouth. Take two times a day         Allergies as of 09/11/2010 - Review Complete 09/11/2010  Allergen Reaction Noted  . Sulfonamide derivatives      No FH colon ca   History   Social History  . Marital Status: Single    Spouse Name: N/A    Number of Children: N/A  . Years of Education: N/A   Social History Main Topics  . Smoking status: Never Smoker   .  Smokeless tobacco: Never Used  . Alcohol Use: No  . Drug Use: No  . Sexually Active: Yes -- Female partner(s)    Birth Control/ Protection: IUD   Other Topics Concern  . None   Social History Narrative  . None    Review of Systems: Gen: Denies fever, chills, anorexia. Denies fatigue, weakness, weight loss.  CV: Denies chest pain, palpitations, syncope, peripheral edema, and claudication. Resp: Denies dyspnea at rest, cough, wheezing, coughing up blood, and pleurisy. GI: Denies vomiting blood, jaundice, and fecal incontinence.   Denies dysphagia or odynophagia. Derm: Denies rash, itching, dry skin Psych: Denies depression, anxiety, memory loss, confusion. No homicidal or suicidal ideation.  Heme: Denies bruising, bleeding, and enlarged lymph nodes.  Physical Exam: BP 124/77  Pulse 60  Temp(Src) 97.4 F (36.3 C) (Temporal)  Ht 5\' 7"  (1.702 m)  Wt 197 lb 3.2 oz (89.449 kg)  BMI 30.89 kg/m2 General:   Alert and oriented. No distress noted. Pleasant and cooperative.  Head:  Normocephalic and atraumatic. Eyes:  Conjuctiva clear without scleral icterus. Mouth:  Oral mucosa pink and moist. Good dentition. No lesions. Heart:  S1, S2 present without murmurs, rubs, or gallops. Regular rate and rhythm. Abdomen:  +BS, soft, non-tender  and non-distended. No rebound or guarding. No HSM or masses noted. Msk:  Symmetrical without gross deformities. Normal posture. Extremities:  Without edema. Neurologic:  Alert and  oriented x4;  grossly normal neurologically. Skin:  Intact without significant lesions or rashes. Psych:  Alert and cooperative. Normal mood and affect.

## 2010-09-11 NOTE — Progress Notes (Signed)
Cc to PCP 

## 2010-09-11 NOTE — Assessment & Plan Note (Signed)
Chronic constipation, unable to tolerate Amitiza 24 BID. Currently remains on Amitiza 8 mcg BID, with a BM averaging weekly. However, pt is not uncomfortable; denies abdominal pain, N/V. Not currently exercising. Will add supplemental miralax as needed. This may be pt's baseline. Likely will not benefit from further testing, as recent colonoscopy was normal. 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 Will continue benefiber, probiotic, amitiza. Follow-up in 6 mos or sooner as needed. Pt agreeable and is satisfied with this plan.

## 2010-09-11 NOTE — Patient Instructions (Signed)
After issues with back resolved, start an exercise program such as walking. Work up to most days of the week, 30-45 minutes at a time.  Miralax 17 grams every other day or every third day as needed for bowel movement.  Continue benefiber daily.  Return in September for regularly scheduled appointment.

## 2010-09-26 ENCOUNTER — Ambulatory Visit (HOSPITAL_COMMUNITY)
Admission: RE | Admit: 2010-09-26 | Discharge: 2010-09-26 | Disposition: A | Discharge status: Home / Self Care | Payer: Medicaid Other | Source: Ambulatory Visit | Attending: Physical Therapy | Admitting: Physical Therapy

## 2010-09-26 DIAGNOSIS — M545 Low back pain, unspecified: Secondary | ICD-10-CM | POA: Insufficient documentation

## 2010-09-26 DIAGNOSIS — M6281 Muscle weakness (generalized): Secondary | ICD-10-CM | POA: Insufficient documentation

## 2010-09-26 DIAGNOSIS — IMO0001 Reserved for inherently not codable concepts without codable children: Secondary | ICD-10-CM | POA: Insufficient documentation

## 2010-09-26 DIAGNOSIS — M25559 Pain in unspecified hip: Secondary | ICD-10-CM | POA: Insufficient documentation

## 2010-10-05 ENCOUNTER — Ambulatory Visit (HOSPITAL_COMMUNITY)
Admission: RE | Admit: 2010-10-05 | Discharge: 2010-10-05 | Disposition: A | Discharge status: Home / Self Care | Payer: Medicaid Other | Source: Ambulatory Visit | Attending: Physical Therapy | Admitting: Physical Therapy

## 2010-10-05 DIAGNOSIS — M6281 Muscle weakness (generalized): Secondary | ICD-10-CM | POA: Insufficient documentation

## 2010-10-05 DIAGNOSIS — IMO0001 Reserved for inherently not codable concepts without codable children: Secondary | ICD-10-CM | POA: Insufficient documentation

## 2010-10-05 DIAGNOSIS — M545 Low back pain, unspecified: Secondary | ICD-10-CM | POA: Insufficient documentation

## 2010-10-05 DIAGNOSIS — M25559 Pain in unspecified hip: Secondary | ICD-10-CM | POA: Insufficient documentation

## 2010-10-15 ENCOUNTER — Ambulatory Visit (HOSPITAL_COMMUNITY)
Admission: RE | Admit: 2010-10-15 | Discharge: 2010-10-15 | Disposition: A | Discharge status: Home / Self Care | Payer: Medicaid Other | Source: Ambulatory Visit | Attending: Internal Medicine | Admitting: Internal Medicine

## 2010-10-22 ENCOUNTER — Ambulatory Visit (HOSPITAL_COMMUNITY)
Admission: RE | Admit: 2010-10-22 | Discharge: 2010-10-22 | Disposition: A | Discharge status: Home / Self Care | Payer: Medicaid Other | Source: Ambulatory Visit | Attending: Internal Medicine | Admitting: Internal Medicine

## 2011-01-01 ENCOUNTER — Ambulatory Visit: Payer: Medicaid Other | Admitting: Gastroenterology

## 2011-01-19 ENCOUNTER — Emergency Department (HOSPITAL_COMMUNITY): Payer: Medicaid Other

## 2011-01-19 ENCOUNTER — Emergency Department (HOSPITAL_COMMUNITY)
Admission: EM | Admit: 2011-01-19 | Discharge: 2011-01-19 | Disposition: A | Discharge status: Home / Self Care | Payer: Medicaid Other | Attending: Emergency Medicine | Admitting: Emergency Medicine

## 2011-01-19 ENCOUNTER — Encounter (HOSPITAL_COMMUNITY): Payer: Self-pay

## 2011-01-19 DIAGNOSIS — R51 Headache: Secondary | ICD-10-CM | POA: Insufficient documentation

## 2011-01-19 DIAGNOSIS — I10 Essential (primary) hypertension: Secondary | ICD-10-CM | POA: Insufficient documentation

## 2011-01-19 MED ORDER — KETOROLAC TROMETHAMINE 60 MG/2ML IM SOLN
60.0000 mg | Freq: Once | INTRAMUSCULAR | Status: AC
  Administered 2011-01-19: 60 mg via INTRAMUSCULAR
  Filled 2011-01-19: qty 2

## 2011-01-19 MED ORDER — METOCLOPRAMIDE HCL 5 MG/ML IJ SOLN
10.0000 mg | Freq: Once | INTRAMUSCULAR | Status: AC
  Administered 2011-01-19: 10 mg via INTRAMUSCULAR
  Filled 2011-01-19: qty 2

## 2011-01-19 MED ORDER — DIPHENHYDRAMINE HCL 50 MG/ML IJ SOLN
50.0000 mg | Freq: Once | INTRAMUSCULAR | Status: AC
  Administered 2011-01-19: 50 mg via INTRAMUSCULAR
  Filled 2011-01-19: qty 1

## 2011-01-19 NOTE — ED Notes (Signed)
Pt presents with headache to back of head. Pt sent by Dr Phillips Odor. Pt states "It is the Worst headache of my life". Pt states symptoms started 2 weeks ago. Pt states she is nauseated but no vomiting. Pt also c/o sound sensitivity. Pt states pain radiates from head down to neck.

## 2011-01-19 NOTE — ED Provider Notes (Signed)
History  Scribed for Dr. Clarene Duke, the patient was seen in room 18. The chart was scribed by Gilman Schmidt. The patients care was started at 1237.  CSN: [id number] Arrival date & time: 01/19/2011  9:49 AM  Chief Complaint  Patient presents with  . Headache    HPI   HPI Pt was seen at 1235.  Sara Pruitt is a 35 y.o. female who presents to the Emergency Department complaining of gradual onset and persistence of constant "headache" x2 weeks. Pt reports headache is located on the back of her head and her left neck. Pt has taken OTC meds without relief.  Has been associated with nausea and light and sound sensitivity. Denies headache was sudden or maximal at onset or at any time.  Denies vomiting/diarrhea, no SOB/cough, no fever, no CP/palpitations, no visual changes, no focal motor weakness, no tingling/numbness in extremities.  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    PAST MEDICAL HISTORY:  Past Medical History  Diagnosis Date  . Constipation     ? IBS  . GERD (gastroesophageal reflux disease)   . HTN (hypertension)   . S/P colonoscopy June 07, 2010    Dr. Jena Gauss: secondary to constipation, normal. 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     LISINOPRIL (PRINIVIL,ZESTRIL) 10 MG TABLET    Take 10 mg by mouth daily.     LUBIPROSTONE (AMITIZA) 8 MCG CAPSULE    Take 8 mcg by mouth. Take one at breakfast and one at supper    MULTIPLE VITAMIN (MULTIVITAMIN PO)    Take by mouth daily.     OMEPRAZOLE (PRILOSEC) 20 MG CAPSULE    Take 20 mg by mouth daily. TAKE ONE 30 MINUTES BEFORE BREAKFAST BY MOUTH DAILY    WHEAT DEXTRIN (BENEFIBER) POWD    Take by mouth. Take two times a day      ALLERGIES:  Allergies as of 01/19/2011 - Review Complete 01/19/2011    Allergen Reaction Noted  . Sulfonamide derivatives        SOCIAL HISTORY: History  Substance Use Topics  . Smoking status: Never Smoker   . Smokeless tobacco: Never Used  . Alcohol Use: No     Review of Systems  Review of Systems ROS: Statement: All systems negative except as marked or noted in the HPI; Constitutional: Negative for fever and chills. ; ; Eyes: Negative for eye pain, redness and discharge. ; ; ENMT: Negative for ear pain, hoarseness, nasal congestion, sinus pressure and sore throat. ; ; Cardiovascular: Negative for chest pain, palpitations, diaphoresis, dyspnea and peripheral edema. ; ; Respiratory: Negative for cough, wheezing and stridor. ; ; Gastrointestinal: +nausea.  Negative for vomiting, diarrhea and abdominal pain, blood in stool, hematemesis, jaundice and rectal bleeding. . ; ; Genitourinary: Negative for dysuria, flank pain and hematuria. ; ; Musculoskeletal: Negative for back pain. Negative for swelling and trauma.; ; Skin: Negative for pruritus, rash, abrasions, blisters, bruising and skin lesion.; ; Neuro: +headache.  Negative for lightheadedness and neck stiffness. Negative for weakness, altered level of consciousness , altered mental status, extremity weakness, paresthesias, involuntary movement, seizure and syncope.       Physical Exam    BP 146/93  Pulse 66  Temp(Src) 98.3 F (36.8 C) (Oral)  Resp 20  Ht 5\' 7"  (1.702 m)  Wt 196 lb (88.905 kg)  BMI 30.70 kg/m2  SpO2 100%  Physical Exam 1242: Physical examination:  Nursing notes reviewed; Vital signs and O2 SAT reviewed;  Constitutional: Well developed, Well nourished, Well hydrated, In no acute distress; Head:  Normocephalic, atraumatic; Eyes: EOMI, PERRL, No scleral icterus; ENMT: TM's clear bilat.  Mouth and pharynx normal, Mucous membranes moist; Neck: Supple, Full range of motion, No lymphadenopathy, no meningeal signs; Cardiovascular: Regular rate and rhythm, No murmur, rub, or gallop;  Respiratory: Breath sounds clear & equal bilaterally, No rales, rhonchi, wheezes, or rub, Normal respiratory effort/excursion; Chest: Nontender, Movement normal; Abdomen: Soft, Nontender, Nondistended, Normal bowel sounds; Spine:  No midline CS, TS, LS tenderness.  +TTP hypertonic left trapezius muscle.  Extremities: Pulses normal, No tenderness, No edema, No calf edema or asymmetry.; Neuro: AA&Ox3, Major CN grossly intact.  No facial droop, speech clear.  Normal coordination.  No gross focal motor or sensory deficits in extremities.; Skin: Color normal, Warm, Dry.  No rash, no petechiae.    ED Course  Procedures  MDM MDM Reviewed: nursing note and vitals Interpretation: labs and CT scan   Ct Head Wo Contrast  01/19/2011  *RADIOLOGY REPORT*  Clinical Data:  Headache  CT HEAD WITHOUT CONTRAST  Technique:  Contiguous axial images were obtained from the base of the skull through the vertex without contrast  Comparison:  10/12/2009  Findings:  The brain has a normal appearance without evidence for hemorrhage, acute infarction, hydrocephalus, or mass lesion.  There is no extra axial fluid collection.  The skull and paranasal sinuses are normal.  IMPRESSION: Normal CT of the head without contrast.  Original Report Authenticated By: Camelia Phenes, M.D.     3:40 PM:  Feels better and wants to go home now.  Refuses to give urine sample for testing.  Dx testing d/w pt and family.  Questions answered.  Verb understanding, agreeable to d/c home with outpt f/u.   Medications given in ED:  diphenhydrAMINE (BENADRYL) injection 50 mg (50 mg Intramuscular Given 01/19/11 1308)  metoCLOPramide (REGLAN) 5 MG/ML injection 10 mg (10 mg Intramuscular Given 01/19/11 1308)  ketorolac (TORADOL) injection 60 mg (60 mg Intramuscular Given 01/19/11 1308)     Akaila Rambo M   I personally performed the services described in this documentation, which was scribed in my presence. The recorded information has been  reviewed and considered. Odesser Tourangeau Allison Quarry, DO 01/19/11 2009

## 2011-01-19 NOTE — ED Notes (Signed)
Pt refused to give urine, EDP aware

## 2011-01-19 NOTE — ED Notes (Signed)
Pt c/o headache x 2 weeks. Pt states light and sounds make the pain worse. Pt also c/o left neck pain.

## 2011-05-29 ENCOUNTER — Encounter (HOSPITAL_COMMUNITY): Payer: Self-pay | Admitting: *Deleted

## 2011-05-29 ENCOUNTER — Emergency Department (HOSPITAL_COMMUNITY): Payer: Medicaid Other

## 2011-05-29 ENCOUNTER — Emergency Department (HOSPITAL_COMMUNITY)
Admission: EM | Admit: 2011-05-29 | Discharge: 2011-05-29 | Disposition: A | Discharge status: Home / Self Care | Payer: Medicaid Other | Attending: Emergency Medicine | Admitting: Emergency Medicine

## 2011-05-29 DIAGNOSIS — I10 Essential (primary) hypertension: Secondary | ICD-10-CM | POA: Insufficient documentation

## 2011-05-29 DIAGNOSIS — K219 Gastro-esophageal reflux disease without esophagitis: Secondary | ICD-10-CM | POA: Insufficient documentation

## 2011-05-29 DIAGNOSIS — R05 Cough: Secondary | ICD-10-CM | POA: Insufficient documentation

## 2011-05-29 DIAGNOSIS — J040 Acute laryngitis: Secondary | ICD-10-CM

## 2011-05-29 DIAGNOSIS — R053 Chronic cough: Secondary | ICD-10-CM

## 2011-05-29 DIAGNOSIS — R059 Cough, unspecified: Secondary | ICD-10-CM | POA: Insufficient documentation

## 2011-05-29 MED ORDER — HYDROCOD POLST-CHLORPHEN POLST 10-8 MG/5ML PO LQCR
5.0000 mL | Freq: Once | ORAL | Status: AC
  Administered 2011-05-29: 5 mL via ORAL
  Filled 2011-05-29: qty 5

## 2011-05-29 MED ORDER — GUAIFENESIN-CODEINE 100-10 MG/5ML PO SYRP: ORAL_SOLUTION | ORAL | Status: DC

## 2011-05-29 NOTE — ED Notes (Signed)
Hoarse, cough, congestion,sore throat.

## 2011-05-29 NOTE — ED Provider Notes (Signed)
History     CSN: [id number]  Arrival date & time 05/29/11  1435   First MD Initiated Contact with Patient 05/29/11 1543      Chief Complaint  Patient presents with  . Cough    (Consider location/radiation/quality/duration/timing/severity/associated sxs/prior treatment) HPI Comments: Cough and laryngitis x 1 week.  Patient is a 36 y.o. female presenting with cough. The history is provided by the patient. No language interpreter was used.  Cough This is a new problem. The problem occurs every few minutes. The problem has not changed since onset.The cough is non-productive. There has been no fever. Pertinent negatives include no sore throat, no shortness of breath and no wheezing. She is not a smoker. Her past medical history does not include pneumonia or asthma.    Past Medical History  Diagnosis Date  . Constipation     ? IBS  . GERD (gastroesophageal reflux disease)   . HTN (hypertension)   . S/P colonoscopy June 07, 2010    Dr. Jena Gauss: secondary to constipation, normal. 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 No pertinent family history.  History  Substance Use Topics  . Smoking status: Never Smoker   . Smokeless tobacco: Never Used  . Alcohol Use: No    OB History    Grav Para Term Preterm Abortions TAB SAB Ect Mult Living                  Review of Systems  HENT: Negative for sore throat.   Respiratory: Positive for cough. Negative for shortness of breath, wheezing and stridor.   All other systems reviewed and are negative.    Allergies  Sulfonamide derivatives  Home Medications   Current Outpatient Rx  Name Route Sig Dispense Refill  . DOCUSATE SODIUM 50 MG PO CAPS Oral Take by mouth daily.    . MULTIVITAMIN PO Oral Take by mouth daily.      Marland Kitchen OMEPRAZOLE 20 MG PO CPDR Oral Take 20 mg by mouth daily. TAKE ONE 30 MINUTES BEFORE BREAKFAST BY MOUTH DAILY      . POLYETHYLENE GLYCOL 3350 PO PACK Oral Take 17 g by mouth daily.    Marland Kitchen LEVONORGESTREL 20 MCG/24HR IU IUD Intrauterine 1 each by Intrauterine route once.        BP 136/86  Pulse 85  Temp(Src) 98.8 F (37.1 C) (Oral)  Resp 18  Ht 5\' 7"  (1.702 m)  Wt 192 lb (87.091 kg)  BMI 30.07 kg/m2  SpO2 97%  Physical Exam  Nursing note and vitals reviewed. Constitutional: She is oriented to person, place, and time. She appears well-developed and well-nourished. No distress.  HENT:  Head: Normocephalic and atraumatic.  Mouth/Throat: Uvula is midline and mucous membranes are normal. No uvula swelling. No oropharyngeal exudate, posterior oropharyngeal edema, posterior oropharyngeal erythema or tonsillar abscesses.       Laryngitic voice.  Eyes: EOM are normal.  Neck: Normal range of motion.  Cardiovascular: Normal rate, regular rhythm and normal heart sounds.   Pulmonary/Chest: Effort normal and breath sounds normal. No accessory muscle usage. Not tachypneic. No respiratory distress. She has no decreased breath sounds. She has no wheezes. She has no rhonchi. She has no rales. She exhibits no tenderness.  Abdominal: Soft. She exhibits no distension. There is no tenderness.  Musculoskeletal: Normal range of motion.  Neurological: She is alert and oriented to person,  place, and time.  Skin: Skin is warm and dry.  Psychiatric: She has a normal mood and affect. Judgment normal.    ED Course  Procedures (including critical care time)  Labs Reviewed - No data to display No results found.   No diagnosis found.    MDM          Worthy Rancher, PA 05/29/11 [phone]

## 2011-05-29 NOTE — ED Provider Notes (Signed)
Medical screening examination/treatment/procedure(s) were performed by non-physician practitioner and as supervising physician I was immediately available for consultation/collaboration.  Donnetta Hutching, MD 05/29/11 2233

## 2011-09-13 ENCOUNTER — Other Ambulatory Visit (HOSPITAL_COMMUNITY)
Admission: RE | Admit: 2011-09-13 | Discharge: 2011-09-13 | Disposition: A | Discharge status: Home / Self Care | Payer: Medicaid Other | Source: Ambulatory Visit | Attending: Obstetrics and Gynecology | Admitting: Obstetrics and Gynecology

## 2011-09-13 ENCOUNTER — Other Ambulatory Visit: Payer: Self-pay | Admitting: Adult Health

## 2011-09-13 DIAGNOSIS — N6320 Unspecified lump in the left breast, unspecified quadrant: Secondary | ICD-10-CM

## 2011-09-13 DIAGNOSIS — Z01419 Encounter for gynecological examination (general) (routine) without abnormal findings: Secondary | ICD-10-CM | POA: Insufficient documentation

## 2011-09-13 DIAGNOSIS — Z1159 Encounter for screening for other viral diseases: Secondary | ICD-10-CM | POA: Insufficient documentation

## 2011-09-13 DIAGNOSIS — IMO0002 Reserved for concepts with insufficient information to code with codable children: Secondary | ICD-10-CM

## 2011-09-13 DIAGNOSIS — N6459 Other signs and symptoms in breast: Secondary | ICD-10-CM

## 2011-09-13 DIAGNOSIS — Z113 Encounter for screening for infections with a predominantly sexual mode of transmission: Secondary | ICD-10-CM | POA: Insufficient documentation

## 2011-09-25 ENCOUNTER — Other Ambulatory Visit (HOSPITAL_COMMUNITY): Payer: Self-pay | Admitting: Adult Health

## 2011-09-25 ENCOUNTER — Ambulatory Visit (HOSPITAL_COMMUNITY)
Admission: RE | Admit: 2011-09-25 | Discharge: 2011-09-25 | Disposition: A | Discharge status: Home / Self Care | Payer: Medicaid Other | Source: Ambulatory Visit | Attending: Adult Health | Admitting: Adult Health

## 2011-09-25 ENCOUNTER — Other Ambulatory Visit: Payer: Self-pay | Admitting: Adult Health

## 2011-09-25 DIAGNOSIS — N63 Unspecified lump in unspecified breast: Secondary | ICD-10-CM | POA: Insufficient documentation

## 2011-09-25 DIAGNOSIS — N6459 Other signs and symptoms in breast: Secondary | ICD-10-CM

## 2011-09-25 DIAGNOSIS — N6320 Unspecified lump in the left breast, unspecified quadrant: Secondary | ICD-10-CM

## 2011-12-25 ENCOUNTER — Other Ambulatory Visit: Payer: Self-pay | Admitting: Otolaryngology

## 2011-12-25 DIAGNOSIS — R439 Unspecified disturbances of smell and taste: Secondary | ICD-10-CM

## 2012-01-01 ENCOUNTER — Ambulatory Visit
Admission: RE | Admit: 2012-01-01 | Discharge: 2012-01-01 | Disposition: A | Discharge status: Home / Self Care | Payer: Medicaid Other | Source: Ambulatory Visit | Attending: Otolaryngology | Admitting: Otolaryngology

## 2012-01-01 DIAGNOSIS — R439 Unspecified disturbances of smell and taste: Secondary | ICD-10-CM

## 2012-01-01 MED ORDER — GADOBENATE DIMEGLUMINE 529 MG/ML IV SOLN
17.0000 mL | Freq: Once | INTRAVENOUS | Status: AC | PRN
  Administered 2012-01-01: 17 mL via INTRAVENOUS

## 2012-09-11 ENCOUNTER — Encounter: Payer: Self-pay | Admitting: *Deleted

## 2012-09-14 ENCOUNTER — Ambulatory Visit (INDEPENDENT_AMBULATORY_CARE_PROVIDER_SITE_OTHER): Payer: BC Managed Care – PPO | Admitting: Obstetrics and Gynecology

## 2012-09-14 ENCOUNTER — Other Ambulatory Visit (HOSPITAL_COMMUNITY)
Admission: RE | Admit: 2012-09-14 | Discharge: 2012-09-14 | Disposition: A | Discharge status: Home / Self Care | Payer: Medicaid Other | Source: Ambulatory Visit | Attending: Obstetrics and Gynecology | Admitting: Obstetrics and Gynecology

## 2012-09-14 ENCOUNTER — Encounter: Payer: Self-pay | Admitting: Obstetrics and Gynecology

## 2012-09-14 VITALS — BP 140/98 | Ht 67.0 in | Wt 183.0 lb

## 2012-09-14 DIAGNOSIS — N879 Dysplasia of cervix uteri, unspecified: Secondary | ICD-10-CM | POA: Insufficient documentation

## 2012-09-14 DIAGNOSIS — D259 Leiomyoma of uterus, unspecified: Secondary | ICD-10-CM | POA: Insufficient documentation

## 2012-09-14 DIAGNOSIS — Z01419 Encounter for gynecological examination (general) (routine) without abnormal findings: Secondary | ICD-10-CM

## 2012-09-14 DIAGNOSIS — N870 Mild cervical dysplasia: Secondary | ICD-10-CM

## 2012-09-14 DIAGNOSIS — Z1151 Encounter for screening for human papillomavirus (HPV): Secondary | ICD-10-CM | POA: Insufficient documentation

## 2012-09-14 NOTE — Progress Notes (Signed)
  Subjective:     Sara Pruitt is a 37 y.o. woman who comes in today for a  pap smear only. Her most recent annual exam was on 2013. Her most recent Pap smear was on May 2030 and showed no abnormalities. 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 [place]. Contraception: IUD Patient has fibroids   Review of Systems Patient has cramping discomfort with her small periods the IUD reduces the menstrual volume but she still has cramps and sometimes has difficulty working due to the menstrual discomfort. She is in school as a Psychiatrist   Objective:    BP 140/98  Ht 5\' 7"  (1.702 m)  Wt 183 lb (83.008 kg)  BMI 28.66 kg/m2 Pelvic Exam: cervix normal in appearance, external genitalia normal and vagina normal without discharge. Uterus enlarged 14 week size, mobile tender to touch due to the fibroid enlargement Pap smear obtained.   Assessment:    Screening pap smear.  uterine fibroids 14 weeks size, stable  3. Contraceptive and menstrual management by IUD Mirena  Plan:    Follow up in 1 year, or as indicated by Pap results.

## 2012-09-14 NOTE — Patient Instructions (Addendum)
We will continue annual exams and Pap smears due to the LEEP procedure  Place pap smear patient instructions here.

## 2013-09-13 ENCOUNTER — Other Ambulatory Visit: Payer: BC Managed Care – PPO | Admitting: Obstetrics and Gynecology

## 2013-09-16 ENCOUNTER — Ambulatory Visit (INDEPENDENT_AMBULATORY_CARE_PROVIDER_SITE_OTHER): Payer: BC Managed Care – PPO | Admitting: Obstetrics & Gynecology

## 2013-09-16 ENCOUNTER — Other Ambulatory Visit (HOSPITAL_COMMUNITY)
Admission: RE | Admit: 2013-09-16 | Discharge: 2013-09-16 | Disposition: A | Discharge status: Home / Self Care | Payer: BC Managed Care – PPO | Source: Ambulatory Visit | Attending: Obstetrics and Gynecology | Admitting: Obstetrics and Gynecology

## 2013-09-16 ENCOUNTER — Encounter: Payer: Self-pay | Admitting: Obstetrics & Gynecology

## 2013-09-16 VITALS — BP 120/70 | Ht 67.0 in | Wt 174.0 lb

## 2013-09-16 DIAGNOSIS — Z01419 Encounter for gynecological examination (general) (routine) without abnormal findings: Secondary | ICD-10-CM | POA: Insufficient documentation

## 2013-09-16 DIAGNOSIS — D259 Leiomyoma of uterus, unspecified: Secondary | ICD-10-CM

## 2013-09-16 MED ORDER — MEGESTROL ACETATE 40 MG PO TABS: 40.0000 mg | ORAL_TABLET | Freq: Every day | ORAL | Status: DC

## 2013-09-16 MED ORDER — MIRABEGRON ER 50 MG PO TB24
50.0000 mg | ORAL_TABLET | Freq: Every day | ORAL | Status: DC
Start: 2013-09-16 — End: 2013-09-29

## 2013-09-16 NOTE — Progress Notes (Signed)
Patient ID: Sara Pruitt, female   DOB: 08-Jan-1976, 38 y.o.   MRN: [id number] Subjective:     Sara Pruitt is a 38 y.o. female here for a routine exam.  No LMP recorded. Patient is not currently having periods (Reason: IUD). G1P1 Birth Control Method:  mirena Menstrual Calendar(currently): bleeds about 15 days a month  Current complaints: pain, bleeding, fibroids.   Current acute medical issues: over active bladder   Recent Gynecologic History No LMP recorded. Patient is not currently having periods (Reason: IUD). Last Pap: 2012,  normal Last mammogram: ,    Past Medical History  Diagnosis Date  . Constipation     ? IBS  . GERD (gastroesophageal reflux disease)   . S/P colonoscopy June 07, 2010    Dr. Gala Romney: secondary to constipation, normal. 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 Abnormal pap   . Fibroids     uterine  . Migraines     Past Surgical History  Procedure Laterality Date  . 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 Marital Status: Single    Spouse Name: N/A    Number of Children: N/A  . Years of Education: N/A   Social History Main Topics  . Smoking status: Never Smoker   . Smokeless tobacco: Never Used  . Alcohol Use: No  . Drug Use: No  . Sexual Activity: Yes    Partners: Male    Birth Control/ Protection: IUD   Other Topics Concern  . None   Social History Narrative  . None    Family History  Problem Relation Age of Onset  . Alcohol abuse Father   . Hypertension Father   . Cancer Mother     colon  . Hypertension Mother   . Fibroids Mother   . Hypertension Brother   . Stroke Maternal Uncle   . Hypertension Maternal Grandmother   . Fibroids Maternal Grandmother   . Hypertension Paternal Grandmother      Review of Systems  Review of Systems  Constitutional: Negative for fever, chills, weight loss,  malaise/fatigue and diaphoresis.  HENT: Negative for hearing loss, ear pain, nosebleeds, congestion, sore throat, neck pain, tinnitus and ear discharge.   Eyes: Negative for blurred vision, double vision, photophobia, pain, discharge and redness.  Respiratory: Negative for cough, hemoptysis, sputum production, shortness of breath, wheezing and stridor.   Cardiovascular: Negative for chest pain, palpitations, orthopnea, claudication, leg swelling and PND.  Gastrointestinal: Positive for abdominal pain. Negative for heartburn, nausea, vomiting, diarrhea, constipation, blood in stool and melena.  Genitourinary: Negative for dysuria, urgency, frequency, hematuria and flank pain.  Musculoskeletal: Negative for myalgias, back pain, joint pain and falls.  Skin: Negative for itching and rash.  Neurological: Negative for dizziness, tingling, tremors, sensory change, speech change, focal weakness, seizures, loss of consciousness, weakness and headaches.  Endo/Heme/Allergies: Negative for environmental allergies and polydipsia. Does not bruise/bleed easily.  Psychiatric/Behavioral: Negative for depression, suicidal ideas, hallucinations, memory loss and substance abuse. The patient is not nervous/anxious and does not have insomnia.        Objective:    Physical Exam  Vitals reviewed. Constitutional: She is oriented to person, place, and time. She appears well-developed and well-nourished.  HENT:  Head: Normocephalic and atraumatic.  Right Ear: External ear normal.  Left Ear: External ear normal.  Nose: Nose normal.  Mouth/Throat: Oropharynx is clear and moist.  Eyes: Conjunctivae and EOM are normal. Pupils are equal, round, and reactive to light. Right eye exhibits no discharge. Left eye exhibits no discharge. No scleral icterus.  Neck: Normal range of motion. Neck supple. No tracheal deviation present. No thyromegaly present.  Cardiovascular: Normal rate, regular rhythm, normal heart sounds  and intact distal pulses.  Exam reveals no gallop and no friction rub.   No murmur heard. Respiratory: Effort normal and breath sounds normal. No respiratory distress. She has no wheezes. She has no rales. She exhibits no tenderness.  GI: Soft. Bowel sounds are normal. She exhibits no distension and no mass. There is no tenderness. There is no rebound and no guarding.  Genitourinary:  Breasts no masses skin changes or nipple changes bilaterally      Vulva is normal without lesions Vagina is pink moist without discharge Cervix normal in appearance and pap is done Uterus is enlarged 14-16 week size fibroids Adnexa is negative with normal sized ovaries   Musculoskeletal: Normal range of motion. She exhibits no edema and no tenderness.  Neurological: She is alert and oriented to person, place, and time. She has normal reflexes. She displays normal reflexes. No cranial nerve deficit. She exhibits normal muscle tone. Coordination normal.  Skin: Skin is warm and dry. No rash noted. No erythema. No pallor.  Psychiatric: She has a normal mood and affect. Her behavior is normal. Judgment and thought content normal.       Assessment:    Healthy female exam.    Plan:    Follow up in: 2 weeks.

## 2013-09-16 NOTE — Addendum Note (Signed)
Addended by: Linton Rump on: 09/16/2013 04:10 PM   Modules accepted: Orders

## 2013-09-29 ENCOUNTER — Emergency Department (HOSPITAL_COMMUNITY)
Admission: EM | Admit: 2013-09-29 | Discharge: 2013-09-29 | Disposition: A | Discharge status: Home / Self Care | Payer: BC Managed Care – PPO | Attending: Emergency Medicine | Admitting: Emergency Medicine

## 2013-09-29 ENCOUNTER — Emergency Department (HOSPITAL_COMMUNITY): Payer: BC Managed Care – PPO

## 2013-09-29 ENCOUNTER — Encounter (HOSPITAL_COMMUNITY): Payer: Self-pay | Admitting: Emergency Medicine

## 2013-09-29 DIAGNOSIS — G43909 Migraine, unspecified, not intractable, without status migrainosus: Secondary | ICD-10-CM | POA: Insufficient documentation

## 2013-09-29 DIAGNOSIS — M25512 Pain in left shoulder: Secondary | ICD-10-CM

## 2013-09-29 DIAGNOSIS — M25519 Pain in unspecified shoulder: Secondary | ICD-10-CM | POA: Insufficient documentation

## 2013-09-29 DIAGNOSIS — Z79899 Other long term (current) drug therapy: Secondary | ICD-10-CM | POA: Insufficient documentation

## 2013-09-29 DIAGNOSIS — R071 Chest pain on breathing: Secondary | ICD-10-CM | POA: Insufficient documentation

## 2013-09-29 DIAGNOSIS — R0789 Other chest pain: Secondary | ICD-10-CM

## 2013-09-29 DIAGNOSIS — Z8742 Personal history of other diseases of the female genital tract: Secondary | ICD-10-CM | POA: Insufficient documentation

## 2013-09-29 DIAGNOSIS — K219 Gastro-esophageal reflux disease without esophagitis: Secondary | ICD-10-CM | POA: Insufficient documentation

## 2013-09-29 MED ORDER — CYCLOBENZAPRINE HCL 5 MG PO TABS: 5.0000 mg | ORAL_TABLET | Freq: Three times a day (TID) | ORAL | Status: DC | PRN

## 2013-09-29 MED ORDER — MELOXICAM 7.5 MG PO TABS: 7.5000 mg | ORAL_TABLET | Freq: Every day | ORAL | Status: DC

## 2013-09-29 MED ORDER — HYDROCODONE-ACETAMINOPHEN 5-325 MG PO TABS: 1.0000 | ORAL_TABLET | ORAL | Status: DC | PRN

## 2013-09-29 NOTE — Discharge Instructions (Signed)
Do not take the muscle relaxant or narcotic if you are driving. They will make you sleepy. Follow up with your doctor. Return here for worsening symptoms.

## 2013-09-29 NOTE — ED Provider Notes (Signed)
Medical screening examination/treatment/procedure(s) were performed by non-physician practitioner and as supervising physician I was immediately available for consultation/collaboration.  Richarda Blade, MD 09/29/13 [phone]

## 2013-09-29 NOTE — ED Provider Notes (Signed)
CSN: [id number]     Arrival date & time 09/29/13  1403 History   First MD Initiated Contact with Patient 09/29/13 1458     Chief Complaint  Patient presents with  . Pleurisy     (Consider location/radiation/quality/duration/timing/severity/associated sxs/prior Treatment) Patient is a 38 y.o. female presenting with chest pain. The history is provided by the patient.  Chest Pain Pain location:  L chest Pain quality: sharp and stabbing   Pain radiates to:  Does not radiate Pain radiates to the back: no   Pain severity:  Severe Onset quality:  Gradual Duration:  3 days Timing:  Constant Progression:  Worsening Chronicity:  New Relieved by:  Nothing Worsened by:  Coughing, deep breathing and movement Associated symptoms: no anxiety, no back pain, no cough, no fever, no headache, no heartburn, no nausea, no near-syncope, no numbness, no palpitations, no shortness of breath, no syncope and not vomiting    Sara Pruitt is a 38 y.o. female who presents to the ED with left chest and shoulder pain that started 3 days ago. 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 She does not remember a specific injury. The pain is worse with deep breath, movement, cough, range of motion of the shoulder and palpation. She is taking nothing for pain.   Past Medical History  Diagnosis Date  . Constipation     ? IBS  . GERD (gastroesophageal reflux disease)   . S/P colonoscopy June 07, 2010    Dr. Gala Romney: secondary to constipation, normal. 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 Abnormal pap   . Fibroids     uterine  . Migraines    Past Surgical History  Procedure Laterality Date  . 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 Alcohol abuse Father   . Hypertension Father   . Cancer Mother     colon  . Hypertension Mother   . Fibroids Mother   . Hypertension Brother   . Stroke Maternal Uncle   . Hypertension Maternal Grandmother    . Fibroids Maternal Grandmother   . Hypertension Paternal Grandmother    History  Substance Use Topics  . Smoking status: Never Smoker   . Smokeless tobacco: Never Used  . Alcohol Use: No   OB History   Grav Para Term Preterm Abortions TAB SAB Ect Mult Living   1 1             Review of Systems  Constitutional: Negative for fever and chills.  HENT: Negative.   Eyes: Negative for visual disturbance.  Respiratory: Negative for cough, shortness of breath and wheezing.   Cardiovascular: Positive for chest pain. Negative for palpitations, syncope and near-syncope.  Gastrointestinal: Negative for heartburn, nausea and vomiting.  Genitourinary: Negative for dysuria, urgency, frequency, vaginal bleeding and vaginal discharge.  Musculoskeletal: Negative for back pain.       Left shoulder pain.  Skin: Negative for rash. Wound: left leg due to injury.  Neurological: Negative for seizures, numbness and headaches.  Psychiatric/Behavioral: Negative for confusion. The patient is not nervous/anxious.       Allergies  Sulfonamide derivatives  Home Medications   Prior to Admission medications   Medication Sig Start Date End Date Taking? Authorizing Provider  dexlansoprazole (DEXILANT) 60 MG capsule Take 60 mg by mouth daily.    Historical Provider, MD  docusate sodium (COLACE) 50 MG capsule Take by mouth  daily.    Historical Provider, MD  guaiFENesin-codeine [facility]) 100-10 MG/5ML syrup 10 mls po q 4-6 hrs prn cough 05/29/11   Richard Sabra Heck, PA-C  L-Lysine 1000 MG TABS Take by mouth.    Historical Provider, MD  levonorgestrel (MIRENA) 20 MCG/24HR IUD 1 each by Intrauterine route once.      Historical Provider, MD  megestrol (MEGACE) 40 MG tablet Take 1 tablet (40 mg total) by mouth daily. 09/16/13   Florian Buff, MD  SUMAtriptan (IMITREX) 100 MG tablet Take 100 mg by mouth every 2 (two) hours as needed for migraine or headache. May repeat in 2 hours if headache persists or recurs.     Historical Provider, MD   BP 154/94  Pulse 84  Temp(Src) 98.4 F (36.9 C) (Oral)  Resp 16  Ht 5\' 7"  (1.702 m)  Wt 170 lb (77.111 kg)  BMI 26.62 kg/m2  SpO2 100% Physical Exam  Nursing note and vitals reviewed. Constitutional: She is oriented to person, place, and time. She appears well-developed and well-nourished.  HENT:  Head: Normocephalic and atraumatic.  Eyes: Conjunctivae and EOM are normal.  Neck: Neck supple.  Cardiovascular: Normal rate and regular rhythm.   Pulmonary/Chest: Effort normal and breath sounds normal. She exhibits tenderness. She exhibits no crepitus and no edema.    Abdominal: Soft. There is no tenderness.  Musculoskeletal: Normal range of motion.       Left shoulder: She exhibits tenderness and pain. She exhibits normal range of motion, no swelling, no crepitus, no deformity, no laceration, no spasm, normal pulse and normal strength.       Arms: Radial pulses strong, adequate circulation, good touch sensation.   Neurological: She is alert and oriented to person, place, and time. She has normal strength. No cranial nerve deficit or sensory deficit. Gait normal.  Reflex Scores:      Bicep reflexes are 2+ on the right side and 2+ on the left side.      Brachioradialis reflexes are 2+ on the right side and 2+ on the left side.      Patellar reflexes are 2+ on the right side and 2+ on the left side.      Achilles reflexes are 2+ on the right side and 2+ on the left side. Pedal pulses equal.   Skin: Skin is warm and dry.  Psychiatric: She has a normal mood and affect. Her behavior is normal.    ED Course  Procedures Imaging Review Dg Chest 2 View  09/29/2013   CLINICAL DATA:  Pleurisy time lungs left-sided pain  EXAM: CHEST  2 VIEW  COMPARISON:  05/29/2011  FINDINGS: Normal mediastinum and cardiac silhouette. Normal pulmonary vasculature. No effusion, infiltrate, or pneumothorax.  IMPRESSION: No acute cardiopulmonary process.   Electronically Signed   By:  Suzy Bouchard M.D.   On: 09/29/2013 14:49     EKG Interpretation   Date/Time:  Wednesday September 29 2013 14:24:23 EDT Ventricular Rate:  77 PR Interval:  147 QRS Duration: 73 QT Interval:  382 QTC Calculation: 432 R Axis:   46 Text Interpretation:  Sinus rhythm No old tracing to compare Confirmed by  [facility]  MD, ELLIOTT [phone]) on 09/29/2013 2:28:26 PM     Dg Chest 2 View  09/29/2013   CLINICAL DATA:  Pleurisy time lungs left-sided pain  EXAM: CHEST  2 VIEW  COMPARISON:  05/29/2011  FINDINGS: Normal mediastinum and cardiac silhouette. Normal pulmonary vasculature. No effusion, infiltrate, or pneumothorax.  IMPRESSION: No acute  cardiopulmonary process.   Electronically Signed   By: Suzy Bouchard M.D.   On: 09/29/2013 14:49     MDM  38 y.o. female with left chest wall and left shoulder pain x 3 days. Pain worse with movement. No cardiac symptoms at this time. I have reviewed this patient's vital signs, nurses notes, appropriate labs and imaging.  I have discussed findings with the patient and plan of care. She voices understanding and agrees with plan. Will treat for pain and inflammation. She will follow up with her PCP or return here as needed. She is scheduled to work on Friday and I will give her a work note for that day.    Medication List    TAKE these medications       cyclobenzaprine 5 MG tablet  Commonly known as:  FLEXERIL  Take 1 tablet (5 mg total) by mouth 3 (three) times daily as needed for muscle spasms.     HYDROcodone-acetaminophen 5-325 MG per tablet  Commonly known as:  NORCO/VICODIN  Take 1 tablet by mouth every 4 (four) hours as needed.     meloxicam 7.5 MG tablet  Commonly known as:  MOBIC  Take 1 tablet (7.5 mg total) by mouth daily.      ASK your doctor about these medications       dexlansoprazole 60 MG capsule  Commonly known as:  DEXILANT  Take 60 mg by mouth daily.     L-Lysine 1000 MG Tabs  Take 1,000 mg by mouth daily.     MIRENA 20  MCG/24HR IUD  Generic drug:  levonorgestrel  1 each by Intrauterine route once.     SUMAtriptan 100 MG tablet  Commonly known as:  IMITREX  Take 100 mg by mouth every 2 (two) hours as needed for migraine or headache. May repeat in 2 hours if headache persists or recurs.            [place], [place] 09/29/13 [phone]

## 2013-09-29 NOTE — ED Notes (Signed)
Pt states having painful inspiration on lt side that started on Monday. Denies chest pain, states the pain is when she breathes. O2 sats 100% on RA.

## 2013-10-01 ENCOUNTER — Other Ambulatory Visit: Payer: Self-pay | Admitting: Obstetrics & Gynecology

## 2013-10-01 ENCOUNTER — Encounter: Payer: Self-pay | Admitting: Obstetrics & Gynecology

## 2013-10-01 ENCOUNTER — Ambulatory Visit (INDEPENDENT_AMBULATORY_CARE_PROVIDER_SITE_OTHER): Payer: BC Managed Care – PPO

## 2013-10-01 ENCOUNTER — Ambulatory Visit (INDEPENDENT_AMBULATORY_CARE_PROVIDER_SITE_OTHER): Payer: BC Managed Care – PPO | Admitting: Obstetrics & Gynecology

## 2013-10-01 VITALS — BP 110/80 | Wt 175.0 lb

## 2013-10-01 DIAGNOSIS — N946 Dysmenorrhea, unspecified: Secondary | ICD-10-CM

## 2013-10-01 DIAGNOSIS — N938 Other specified abnormal uterine and vaginal bleeding: Secondary | ICD-10-CM

## 2013-10-01 DIAGNOSIS — N949 Unspecified condition associated with female genital organs and menstrual cycle: Secondary | ICD-10-CM

## 2013-10-01 DIAGNOSIS — N925 Other specified irregular menstruation: Secondary | ICD-10-CM

## 2013-10-01 DIAGNOSIS — D259 Leiomyoma of uterus, unspecified: Secondary | ICD-10-CM

## 2013-10-01 DIAGNOSIS — N920 Excessive and frequent menstruation with regular cycle: Secondary | ICD-10-CM

## 2013-10-01 DIAGNOSIS — N921 Excessive and frequent menstruation with irregular cycle: Secondary | ICD-10-CM

## 2013-10-01 MED ORDER — MIRABEGRON ER 50 MG PO TB24: 50.0000 mg | ORAL_TABLET | Freq: Every day | ORAL | Status: DC

## 2013-10-01 NOTE — Progress Notes (Signed)
Patient ID: Sara Pruitt, female   DOB: 19-Apr-1976, 38 y.o.   MRN: [id number] Dg Chest 2 View  09/29/2013   CLINICAL DATA:  Pleurisy time lungs left-sided pain  EXAM: CHEST  2 VIEW  COMPARISON:  05/29/2011  FINDINGS: Normal mediastinum and cardiac silhouette. Normal pulmonary vasculature. No effusion, infiltrate, or pneumothorax.  IMPRESSION: No acute cardiopulmonary process.   Electronically Signed   By: Suzy Bouchard M.D.   On: 09/29/2013 14:49   US Transvaginal Non-ob  10/01/2013   GYNECOLOGIC SONOGRAM   Sara Pruitt is a 38 y.o. G1P1 for a pelvic sonogram for fibroids,  DUB, pelvic pain. Pt has Mirena IUD  Uterus                      13.3 x 10.0 x 7.2 cm, anteverted with multiple  fibroids noted (largest 2= 5.0 & 3.9cm) 1 pedunculated=2.5cm,   Endometrium          Walls=1.5 mm, 4.52mm cavity noted with fluid within,  distorted by multiple fibroids, IUD appears to be lower within cavity  Right ovary             2.6 x 1.6 x 1.4 cm,   Left ovary                3.8 x 2.7 x 1.9 cm,   No free fluid or adnexal masses noted within the pelvis transabdominally  or transvaginally  Technician Comments:  Anteverted uterus enlarged with multiple fibroids, Endometrium distorted  by fibroids, IUD appears to be lower within endometrial cavity, bilateral  adnexa/ovaries appear WNL no free fluid or adnexal masse noted within the  pelvis transabdominally or transvaginally   Alicia Amel 10/01/2013 10:33 AM  Enlarged fibroid uterus with endometrial distortion Normal ovaries  Florian Buff 10/01/2013 11:12 AM     Sonogram noted above Pt has been having increasingly heavy periods over the past 4 years IUD has made it better but then getting worse again with lots of pain, heavy clots, soils clothes and sheets  No burning with urination, or urgency, does have uregency responding to myrbetriq No nausea, vomiting or diarrhea Nor fever chills or other constitutional symptoms  Exam Abdomen soft non  tender Pelvic unchanged 36-16 weeks size fibroids  Assessment:Plan 1)  Enlarged fibroid uterus with increasing symptoms: supracervical hysterectomy 11/24/2013 2) Urgency responding to myrbetriq 50  Discussed surgery and answered questions  Past Medical History  Diagnosis Date  . Constipation     ? IBS  . GERD (gastroesophageal reflux disease)   . S/P colonoscopy June 07, 2010    Dr. Gala Romney: secondary to constipation, normal. 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 Abnormal pap   . Fibroids     uterine  . Migraines     Past Surgical History  Procedure Laterality Date  . 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 Sulfonamide Derivatives     History   Social History  . Marital Status: Single    Spouse Name: N/A    Number of Children: N/A  . Years of Education: N/A   Social History Main Topics  . Smoking status: Never Smoker   . Smokeless tobacco: Never Used  . Alcohol Use: No  .  Drug Use: No  . Sexual Activity: Yes    Partners: Male    Birth Control/ Protection: IUD   Other Topics Concern  . None   Social History Narrative  . None    Family History  Problem Relation Age of Onset  . Alcohol abuse Father   . Hypertension Father   . Cancer Mother     colon  . Hypertension Mother   . Fibroids Mother   . Hypertension Brother   . Stroke Maternal Uncle   . Hypertension Maternal Grandmother   . Fibroids Maternal Grandmother   . Hypertension Paternal Grandmother

## 2013-10-22 ENCOUNTER — Telehealth: Payer: Self-pay | Admitting: Obstetrics & Gynecology

## 2013-10-22 NOTE — Telephone Encounter (Signed)
Spoke with pt. Pt is scheduled for surgery on 11/24/13 and she is requesting a work note now. She needs to know how long she will be out of work then. Pt states her work schedule is done a month at a time. Would like it by Monday. Please advise. Thanks!!! [employer]

## 2013-10-25 ENCOUNTER — Encounter: Payer: Self-pay | Admitting: Obstetrics & Gynecology

## 2013-11-17 NOTE — Patient Instructions (Signed)
Sara Pruitt  11/17/2013   Your procedure is scheduled on:  11/24/2013  Report to [facility] at  49  AM.  Call this number if you have problems the morning of surgery: [phone]   Remember:   Do not eat food or drink liquids after midnight.   Take these medicines the morning of surgery with A SIP OF WATER:  Hydrocodone, flexaril, dexilant, mobic   Do not wear jewelry, make-up or nail polish.  Do not wear lotions, powders, or perfumes.   Do not shave 48 hours prior to surgery. Men may shave face and neck.  Do not bring valuables to the hospital.  [facility] is not responsible for any belongings or valuables.               Contacts, dentures or bridgework may not be worn into surgery.  Leave suitcase in the car. After surgery it may be brought to your room.  For patients admitted to the hospital, discharge time is determined by your treatment team.               Patients discharged the day of surgery will not be allowed to drive home.  Name and phone number of your driver: family  Special Instructions: Shower using CHG 2 nights before surgery and the night before surgery.  If you shower the day of surgery use CHG.  Use special wash - you have one bottle of CHG for all showers.  You should use approximately 1/3 of the bottle for each shower.   Please read over the following fact sheets that you were given: Pain Booklet, Coughing and Deep Breathing, Surgical Site Infection Prevention, Anesthesia Post-op Instructions and Care and Recovery After Surgery Supracervical Hysterectomy A supracervical hysterectomy is surgery to remove the top part of the uterus, but not the cervix. You will no longer have menstrual periods or be able to get pregnant after this surgery. The fallopian tubes and ovaries may also be removed (bilateral salpingo-oophorectomy) during this surgery. This surgery is usually performed using a minimally invasive technique called laparoscopy. This technique  allows the surgery to be done through small incisions. The minimally invasive technique provides benefits such as less pain, less risk of infection, and shorter recovery time. LET [facility] CARE PROVIDER KNOW ABOUT:  Any allergies you have.  All medicines you are taking, including vitamins, herbs, eye drops, creams, and over-the-counter medicines.  Previous problems you or members of your family have had with the use of anesthetics.  Any blood disorders you have.  Previous surgeries you have had.  Medical conditions you have. RISKS AND COMPLICATIONS  Generally, this is a safe procedure. However, as with any procedure, complications can occur. Possible complications include:  Bleeding.  Blood clots in the legs or lung.  Infection.  Injury to surrounding organs.  Problems related to anesthesia.  Conversion to an open abdominal surgery.  Additional surgery later to remove the cervix if you have problems with the cervix. BEFORE THE PROCEDURE  Ask your health care provider about changing or stopping your regular medicines.  Do not take aspirin or blood thinners (anticoagulants) for 1 week before the surgery, or as directed by your health care provider.  Do not eat or drink anything for 8 hours before the surgery, or as directed by your health care provider.  Quit smoking if you smoke.  Arrange for a ride home after surgery and for someone to help you at home during recovery.  PROCEDURE   You will be given an antibiotic medicine.  An IV tube will be placed in one of your veins. You will be given medicine to make you sleep (general anesthetic).  A gas (carbon dioxide) will be used to inflate your abdomen. This will allow your surgeon to look inside your abdomen, perform your surgery, and treat any other problems found if necessary.  Three or four small incisions will be made in your abdomen. One of these incisions will be made in the area of your belly button (navel). A  thin, flexible tube with a tiny camera and light on the end of it (laparoscope) will be inserted into the incision. The camera on the laparoscope sends a picture to a TV screen in the operating room. This gives your surgeon a good view inside the abdomen.  Other surgical instruments will be inserted through the other incisions.  The uterus will be cut into small pieces and removed through the small incisions.  Your incisions will be closed. AFTER THE PROCEDURE   You will be taken to a recovery area where your progress will be monitored until you are awake, stable, and taking fluids well. If there are no other problems, you will then be moved to a regular hospital room, or you will be allowed to go home.  You will likely have minimal discomfort after the surgery because the incisions are so small with the laparoscopic technique.  You will be given pain medicine while you are in the hospital and for when you go home.  If a bilateral salpingo-oophorectomy was performed before menopause, you will go through a sudden (abrupt) menopause. This can be helped with hormone medicines. Document Released: 10/02/2007 Document Revised: 02/03/2013 Document Reviewed: 10/16/2012 [facility] Patient Information 2015 [place], [place]. This information is not intended to replace advice given to you by your health care provider. Make sure you discuss any questions you have with your health care provider. PATIENT INSTRUCTIONS POST-ANESTHESIA  IMMEDIATELY FOLLOWING SURGERY:  Do not drive or operate machinery for the first twenty four hours after surgery.  Do not make any important decisions for twenty four hours after surgery or while taking narcotic pain medications or sedatives.  If you develop intractable nausea and vomiting or a severe headache please notify your doctor immediately.  FOLLOW-UP:  Please make an appointment with your surgeon as instructed. You do not need to follow up with anesthesia unless  specifically instructed to do so.  WOUND CARE INSTRUCTIONS (if applicable):  Keep a dry clean dressing on the anesthesia/puncture wound site if there is drainage.  Once the wound has quit draining you may leave it open to air.  Generally you should leave the bandage intact for twenty four hours unless there is drainage.  If the epidural site drains for more than 36-48 hours please call the anesthesia department.  QUESTIONS?:  Please feel free to call your physician or the hospital operator if you have any questions, and they will be happy to assist you.

## 2013-11-18 ENCOUNTER — Encounter (HOSPITAL_COMMUNITY): Payer: Self-pay

## 2013-11-18 ENCOUNTER — Ambulatory Visit (INDEPENDENT_AMBULATORY_CARE_PROVIDER_SITE_OTHER): Payer: BC Managed Care – PPO | Admitting: Obstetrics & Gynecology

## 2013-11-18 ENCOUNTER — Encounter (HOSPITAL_COMMUNITY)
Admission: RE | Admit: 2013-11-18 | Discharge: 2013-11-18 | Disposition: A | Discharge status: Home / Self Care | Payer: BC Managed Care – PPO | Source: Ambulatory Visit | Attending: Obstetrics & Gynecology | Admitting: Obstetrics & Gynecology

## 2013-11-18 ENCOUNTER — Encounter: Payer: Self-pay | Admitting: Obstetrics & Gynecology

## 2013-11-18 VITALS — BP 120/70 | Ht 67.0 in | Wt 188.0 lb

## 2013-11-18 DIAGNOSIS — Z029 Encounter for administrative examinations, unspecified: Secondary | ICD-10-CM

## 2013-11-18 DIAGNOSIS — Z01818 Encounter for other preprocedural examination: Secondary | ICD-10-CM | POA: Insufficient documentation

## 2013-11-18 DIAGNOSIS — N852 Hypertrophy of uterus: Secondary | ICD-10-CM

## 2013-11-18 DIAGNOSIS — Z01812 Encounter for preprocedural laboratory examination: Secondary | ICD-10-CM | POA: Insufficient documentation

## 2013-11-18 HISTORY — DX: Personal history of diseases of the blood and blood-forming organs and certain disorders involving the immune mechanism: Z86.2

## 2013-11-18 HISTORY — DX: Chronic fatigue, unspecified: R53.82

## 2013-11-18 HISTORY — DX: Sickle-cell trait: D57.3

## 2013-11-18 LAB — COMPREHENSIVE METABOLIC PANEL
ALK PHOS: 82 U/L (ref 39–117)
ALT: 16 U/L (ref 0–35)
ANION GAP: 13 (ref 5–15)
AST: 17 U/L (ref 0–37)
Albumin: 3.8 g/dL (ref 3.5–5.2)
BUN: 15 mg/dL (ref 6–23)
CALCIUM: 9.2 mg/dL (ref 8.4–10.5)
CO2: 25 mEq/L (ref 19–32)
CREATININE: 0.95 mg/dL (ref 0.50–1.10)
Chloride: 105 mEq/L (ref 96–112)
GFR calc non Af Amer: 77 mL/min — ABNORMAL LOW (ref >90)
GFR, EST AFRICAN AMERICAN: 89 mL/min — AB (ref >90)
GLUCOSE: 75 mg/dL (ref 70–99)
POTASSIUM: 3.7 meq/L (ref 3.7–5.3)
Sodium: 143 mEq/L (ref 137–147)
TOTAL PROTEIN: 7.4 g/dL (ref 6.0–8.3)
Total Bilirubin: 0.2 mg/dL — ABNORMAL LOW (ref 0.3–1.2)

## 2013-11-18 LAB — URINALYSIS, ROUTINE W REFLEX MICROSCOPIC
BILIRUBIN URINE: NEGATIVE
Glucose, UA: NEGATIVE mg/dL
Hgb urine dipstick: NEGATIVE
KETONES UR: NEGATIVE mg/dL
Leukocytes, UA: NEGATIVE
Nitrite: NEGATIVE
Protein, ur: NEGATIVE mg/dL
SPECIFIC GRAVITY, URINE: 1.01 (ref 1.005–1.030)
UROBILINOGEN UA: 0.2 mg/dL (ref 0.0–1.0)
pH: 6.5 (ref 5.0–8.0)

## 2013-11-18 LAB — CBC
HEMATOCRIT: 35.6 % — AB (ref 36.0–46.0)
Hemoglobin: 12.4 g/dL (ref 12.0–15.0)
MCH: 29.4 pg (ref 26.0–34.0)
MCHC: 34.8 g/dL (ref 30.0–36.0)
MCV: 84.4 fL (ref 78.0–100.0)
Platelets: 265 10*3/uL (ref 150–400)
RBC: 4.22 MIL/uL (ref 3.87–5.11)
RDW: 13.6 % (ref 11.5–15.5)
WBC: 7.5 10*3/uL (ref 4.0–10.5)

## 2013-11-18 LAB — HCG, QUANTITATIVE, PREGNANCY

## 2013-11-18 NOTE — Progress Notes (Signed)
Patient ID: Sara Pruitt, female   DOB: Feb 28, 1976, 38 y.o.   MRN: [id number] Preoperative History and Physical  Sara Pruitt is a 38 y.o. G1P1 with No LMP recorded. Patient is not currently having periods (Reason: IUD). admitted for a abdominal supracervical hysterectomy .  Pt has enlarged fibroid uterus 14-16 weeks with heavy menstrual periods.    PMH:    Past Medical History  Diagnosis Date  . Constipation     ? IBS  . GERD (gastroesophageal reflux disease)   . S/P colonoscopy June 07, 2010    Dr. Gala Romney: secondary to constipation, normal. 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 Abnormal pap   . Fibroids     uterine  . Migraines     PSH:     Past Surgical History  Procedure Laterality Date  . 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 Toe surgery      POb/GynH:      OB History   Grav Para Term Preterm Abortions TAB SAB Ect Mult Living   1 1              SH:   History  Substance Use Topics  . Smoking status: Never Smoker   . Smokeless tobacco: Never Used  . Alcohol Use: No    FH:    Family History  Problem Relation Age of Onset  . Alcohol abuse Father   . Hypertension Father   . Cancer Mother     colon  . Hypertension Mother   . Fibroids Mother   . Hypertension Brother   . Stroke Maternal Uncle   . Hypertension Maternal Grandmother   . Fibroids Maternal Grandmother   . Hypertension Paternal Grandmother      Allergies:  Allergies  Allergen Reactions  . Sulfonamide Derivatives     Medications:      Current outpatient prescriptions:Biotin 1000 MCG tablet, Take 1,000 mcg by mouth 3 (three) times daily., Disp: , Rfl: ;  dexlansoprazole (DEXILANT) 60 MG capsule, Take 60 mg by mouth daily., Disp: , Rfl: ;  HYDROcodone-acetaminophen (NORCO/VICODIN) 5-325 MG per tablet, Take 1 tablet by mouth every 4 (four) hours as needed., Disp: 15 tablet, Rfl: 0;  L-Lysine 1000 MG TABS, Take 1,000 mg by mouth daily. , Disp: , Rfl:  levonorgestrel (MIRENA) 20  MCG/24HR IUD, 1 each by Intrauterine route once.  , Disp: , Rfl: ;  megestrol (MEGACE) 40 MG tablet, Take 40 mg by mouth daily., Disp: , Rfl: ;  mirabegron ER (MYRBETRIQ) 50 MG TB24 tablet, Take 1 tablet (50 mg total) by mouth daily., Disp: 30 tablet, Rfl: 11;  Multiple Vitamin (MULTIVITAMIN) tablet, Take 1 tablet by mouth daily., Disp: , Rfl:  SUMAtriptan (IMITREX) 100 MG tablet, Take 100 mg by mouth every 2 (two) hours as needed for migraine or headache. May repeat in 2 hours if headache persists or recurs., Disp: , Rfl: ;  cyclobenzaprine (FLEXERIL) 5 MG tablet, Take 1 tablet (5 mg total) by mouth 3 (three) times daily as needed for muscle spasms., Disp: 30 tablet, Rfl: 0;  meloxicam (MOBIC) 7.5 MG tablet, Take 1 tablet (7.5 mg total) by mouth daily., Disp: 10 tablet, Rfl: 0  Review of Systems:   Review of Systems  Constitutional: Negative for fever, chills, weight loss, malaise/fatigue and diaphoresis.  HENT: Negative for hearing loss, ear pain, nosebleeds, congestion, sore throat, neck pain, tinnitus and ear discharge.   Eyes: Negative for blurred vision, double vision, photophobia, pain, discharge and  redness.  Respiratory: Negative for cough, hemoptysis, sputum production, shortness of breath, wheezing and stridor.   Cardiovascular: Negative for chest pain, palpitations, orthopnea, claudication, leg swelling and PND.  Gastrointestinal: Positive for abdominal pain. Negative for heartburn, nausea, vomiting, diarrhea, constipation, blood in stool and melena.  Genitourinary: Negative for dysuria, urgency, frequency, hematuria and flank pain.  Musculoskeletal: Negative for myalgias, back pain, joint pain and falls.  Skin: Negative for itching and rash.  Neurological: Negative for dizziness, tingling, tremors, sensory change, speech change, focal weakness, seizures, loss of consciousness, weakness and headaches.  Endo/Heme/Allergies: Negative for environmental allergies and polydipsia. Does not  bruise/bleed easily.  Psychiatric/Behavioral: Negative for depression, suicidal ideas, hallucinations, memory loss and substance abuse. The patient is not nervous/anxious and does not have insomnia.      PHYSICAL EXAM:  Blood pressure 120/70, height 5\' 7"  (1.702 m), weight 188 lb (85.276 kg).    Vitals reviewed. Constitutional: She is oriented to person, place, and time. She appears well-developed and well-nourished.  HENT:  Head: Normocephalic and atraumatic.  Right Ear: External ear normal.  Left Ear: External ear normal.  Nose: Nose normal.  Mouth/Throat: Oropharynx is clear and moist.  Eyes: Conjunctivae and EOM are normal. Pupils are equal, round, and reactive to light. Right eye exhibits no discharge. Left eye exhibits no discharge. No scleral icterus.  Neck: Normal range of motion. Neck supple. No tracheal deviation present. No thyromegaly present.  Cardiovascular: Normal rate, regular rhythm, normal heart sounds and intact distal pulses.  Exam reveals no gallop and no friction rub.   No murmur heard. Respiratory: Effort normal and breath sounds normal. No respiratory distress. She has no wheezes. She has no rales. She exhibits no tenderness.  GI: Soft. Bowel sounds are normal. She exhibits no distension and no mass. There is tenderness. There is no rebound and no guarding.  Genitourinary:       Vulva is normal without lesions Vagina is pink moist without discharge Cervix normal in appearance and pap is normal Uterus is enlarged to 14-16 weeks size due to multiple fibroids the largest of which is 6.1 cm Adnexa is negative with normal sized ovaries by sonogram  Musculoskeletal: Normal range of motion. She exhibits no edema and no tenderness.  Neurological: She is alert and oriented to person, place, and time. She has normal reflexes. She displays normal reflexes. No cranial nerve deficit. She exhibits normal muscle tone. Coordination normal.  Skin: Skin is warm and dry. No rash  noted. No erythema. No pallor.  Psychiatric: She has a normal mood and affect. Her behavior is normal. Judgment and thought content normal.    Labs: No results found for this or any previous visit (from the past 336 hour(s)).  EKG: Orders placed during the hospital encounter of 09/29/13  . ED EKG  . ED EKG  . EKG 12-LEAD  . EKG 12-LEAD  . EKG    Imaging Studies: No results found.    Assessment: Patient Active Problem List   Diagnosis Date Noted  . Menometrorrhagia 10/01/2013  . Dysmenorrhea 10/01/2013  . Fibroid uterus 09/14/2012  . Cervical dysplasia , Tx'd LEEP 09/14/2012  . GERD 05/03/2010  . CONSTIPATION 05/03/2010    Plan: Abdominal supracervical hysterectomy with removal of IUD  Alyus Mofield H 11/18/2013 11:38 AM

## 2013-11-18 NOTE — Pre-Procedure Instructions (Signed)
Patient given information to sign up for my chart at home. 

## 2013-11-21 LAB — TYPE AND SCREEN
ABO/RH(D): B POS
ANTIBODY SCREEN: NEGATIVE

## 2013-11-24 ENCOUNTER — Ambulatory Visit (HOSPITAL_COMMUNITY): Payer: BC Managed Care – PPO | Admitting: Anesthesiology

## 2013-11-24 ENCOUNTER — Observation Stay (HOSPITAL_COMMUNITY)
Admission: RE | Admit: 2013-11-24 | Discharge: 2013-11-25 | Disposition: A | Discharge status: Home / Self Care | Payer: BC Managed Care – PPO | Source: Ambulatory Visit | Attending: Obstetrics & Gynecology | Admitting: Obstetrics & Gynecology

## 2013-11-24 ENCOUNTER — Encounter (HOSPITAL_COMMUNITY): Payer: Self-pay | Admitting: *Deleted

## 2013-11-24 ENCOUNTER — Encounter (HOSPITAL_COMMUNITY): Payer: BC Managed Care – PPO | Admitting: Anesthesiology

## 2013-11-24 ENCOUNTER — Encounter (HOSPITAL_COMMUNITY)
Admission: RE | Disposition: A | Discharge status: Home / Self Care | Payer: Self-pay | Source: Ambulatory Visit | Attending: Obstetrics & Gynecology

## 2013-11-24 DIAGNOSIS — D250 Submucous leiomyoma of uterus: Secondary | ICD-10-CM

## 2013-11-24 DIAGNOSIS — Z79899 Other long term (current) drug therapy: Secondary | ICD-10-CM | POA: Insufficient documentation

## 2013-11-24 DIAGNOSIS — Z975 Presence of (intrauterine) contraceptive device: Secondary | ICD-10-CM | POA: Insufficient documentation

## 2013-11-24 DIAGNOSIS — N920 Excessive and frequent menstruation with regular cycle: Secondary | ICD-10-CM | POA: Diagnosis not present

## 2013-11-24 DIAGNOSIS — G43909 Migraine, unspecified, not intractable, without status migrainosus: Secondary | ICD-10-CM | POA: Diagnosis not present

## 2013-11-24 DIAGNOSIS — D259 Leiomyoma of uterus, unspecified: Secondary | ICD-10-CM | POA: Diagnosis present

## 2013-11-24 DIAGNOSIS — D251 Intramural leiomyoma of uterus: Secondary | ICD-10-CM

## 2013-11-24 DIAGNOSIS — N949 Unspecified condition associated with female genital organs and menstrual cycle: Secondary | ICD-10-CM

## 2013-11-24 DIAGNOSIS — K219 Gastro-esophageal reflux disease without esophagitis: Secondary | ICD-10-CM | POA: Diagnosis not present

## 2013-11-24 DIAGNOSIS — N852 Hypertrophy of uterus: Secondary | ICD-10-CM

## 2013-11-24 DIAGNOSIS — M62838 Other muscle spasm: Secondary | ICD-10-CM | POA: Insufficient documentation

## 2013-11-24 DIAGNOSIS — Z90710 Acquired absence of both cervix and uterus: Secondary | ICD-10-CM | POA: Insufficient documentation

## 2013-11-24 HISTORY — PX: SUPRACERVICAL ABDOMINAL HYSTERECTOMY: SHX5393

## 2013-11-24 HISTORY — PX: BILATERAL SALPINGECTOMY: SHX5743

## 2013-11-24 SURGERY — HYSTERECTOMY, SUPRACERVICAL, ABDOMINAL
Anesthesia: General | Site: Abdomen

## 2013-11-24 MED ORDER — KETOROLAC TROMETHAMINE 30 MG/ML IJ SOLN
30.0000 mg | Freq: Once | INTRAMUSCULAR | Status: AC
  Administered 2013-11-24: 30 mg via INTRAVENOUS

## 2013-11-24 MED ORDER — NEOSTIGMINE METHYLSULFATE 10 MG/10ML IV SOLN
INTRAVENOUS | Status: DC | PRN
  Administered 2013-11-24: 2 mg via INTRAVENOUS

## 2013-11-24 MED ORDER — SODIUM CHLORIDE 0.9 % IJ SOLN
INTRAMUSCULAR | Status: AC
  Filled 2013-11-24: qty 20

## 2013-11-24 MED ORDER — LYSINE 500 MG PO TABS: 2.0000 | ORAL_TABLET | Freq: Three times a day (TID) | ORAL | Status: DC

## 2013-11-24 MED ORDER — KCL IN DEXTROSE-NACL 20-5-0.45 MEQ/L-%-% IV SOLN
INTRAVENOUS | Status: DC
  Administered 2013-11-24: 14:00:00 via INTRAVENOUS

## 2013-11-24 MED ORDER — FENTANYL CITRATE 0.05 MG/ML IJ SOLN
INTRAMUSCULAR | Status: AC
  Filled 2013-11-24: qty 2

## 2013-11-24 MED ORDER — FENTANYL CITRATE 0.05 MG/ML IJ SOLN
25.0000 ug | INTRAMUSCULAR | Status: DC | PRN
  Administered 2013-11-24 (×4): 50 ug via INTRAVENOUS
  Filled 2013-11-24: qty 2

## 2013-11-24 MED ORDER — MIDAZOLAM HCL 2 MG/2ML IJ SOLN
INTRAMUSCULAR | Status: AC
  Filled 2013-11-24: qty 2

## 2013-11-24 MED ORDER — CEFAZOLIN SODIUM-DEXTROSE 2-3 GM-% IV SOLR
2.0000 g | INTRAVENOUS | Status: AC
  Administered 2013-11-24: 2 g via INTRAVENOUS

## 2013-11-24 MED ORDER — ONDANSETRON HCL 4 MG/2ML IJ SOLN
INTRAMUSCULAR | Status: AC
  Filled 2013-11-24: qty 2

## 2013-11-24 MED ORDER — ADULT MULTIVITAMIN W/MINERALS CH: 1.0000 | ORAL_TABLET | Freq: Every day | ORAL | Status: DC

## 2013-11-24 MED ORDER — SODIUM CHLORIDE 0.9 % IJ SOLN
INTRAMUSCULAR | Status: DC | PRN
  Administered 2013-11-24: 20 mL via INTRAVENOUS

## 2013-11-24 MED ORDER — ROCURONIUM BROMIDE 50 MG/5ML IV SOLN
INTRAVENOUS | Status: AC
  Filled 2013-11-24: qty 1

## 2013-11-24 MED ORDER — BUPIVACAINE LIPOSOME 1.3 % IJ SUSP
20.0000 mL | Freq: Once | INTRAMUSCULAR | Status: DC
  Filled 2013-11-24: qty 20

## 2013-11-24 MED ORDER — LIDOCAINE HCL (PF) 1 % IJ SOLN
INTRAMUSCULAR | Status: AC
  Filled 2013-11-24: qty 5

## 2013-11-24 MED ORDER — ROCURONIUM BROMIDE 100 MG/10ML IV SOLN
INTRAVENOUS | Status: DC | PRN
  Administered 2013-11-24: 10 mg via INTRAVENOUS
  Administered 2013-11-24: 40 mg via INTRAVENOUS

## 2013-11-24 MED ORDER — ZOLPIDEM TARTRATE 5 MG PO TABS: 5.0000 mg | ORAL_TABLET | Freq: Every evening | ORAL | Status: DC | PRN

## 2013-11-24 MED ORDER — HYDROMORPHONE HCL PF 1 MG/ML IJ SOLN: 1.0000 mg | INTRAMUSCULAR | Status: DC | PRN

## 2013-11-24 MED ORDER — KETOROLAC TROMETHAMINE 30 MG/ML IJ SOLN
INTRAMUSCULAR | Status: AC
  Filled 2013-11-24: qty 1

## 2013-11-24 MED ORDER — SODIUM CHLORIDE 0.9 % IR SOLN
Status: DC | PRN
  Administered 2013-11-24: 2000 mL

## 2013-11-24 MED ORDER — ONDANSETRON 8 MG/NS 50 ML IVPB
8.0000 mg | Freq: Four times a day (QID) | INTRAVENOUS | Status: DC | PRN
  Filled 2013-11-24: qty 8

## 2013-11-24 MED ORDER — FENTANYL CITRATE 0.05 MG/ML IJ SOLN
INTRAMUSCULAR | Status: DC | PRN
  Administered 2013-11-24: 100 ug via INTRAVENOUS
  Administered 2013-11-24 (×5): 50 ug via INTRAVENOUS

## 2013-11-24 MED ORDER — FENTANYL CITRATE 0.05 MG/ML IJ SOLN
INTRAMUSCULAR | Status: AC
  Filled 2013-11-24: qty 5

## 2013-11-24 MED ORDER — MIRABEGRON ER 25 MG PO TB24
50.0000 mg | ORAL_TABLET | Freq: Every day | ORAL | Status: DC
  Administered 2013-11-24: 50 mg via ORAL
  Filled 2013-11-24 (×3): qty 2

## 2013-11-24 MED ORDER — CEFAZOLIN SODIUM-DEXTROSE 2-3 GM-% IV SOLR
INTRAVENOUS | Status: AC
  Filled 2013-11-24: qty 50

## 2013-11-24 MED ORDER — GLYCOPYRROLATE 0.2 MG/ML IJ SOLN
INTRAMUSCULAR | Status: AC
  Filled 2013-11-24: qty 2

## 2013-11-24 MED ORDER — ALUM & MAG HYDROXIDE-SIMETH 200-200-20 MG/5ML PO SUSP: 30.0000 mL | ORAL | Status: DC | PRN

## 2013-11-24 MED ORDER — ONDANSETRON HCL 4 MG/2ML IJ SOLN
4.0000 mg | Freq: Once | INTRAMUSCULAR | Status: AC
  Administered 2013-11-24: 4 mg via INTRAVENOUS

## 2013-11-24 MED ORDER — OXYCODONE-ACETAMINOPHEN 5-325 MG PO TABS
1.0000 | ORAL_TABLET | ORAL | Status: DC | PRN
  Administered 2013-11-24: 1 via ORAL
  Administered 2013-11-24 – 2013-11-25 (×2): 2 via ORAL
  Administered 2013-11-25: 1 via ORAL
  Filled 2013-11-24 (×2): qty 1
  Filled 2013-11-24 (×2): qty 2

## 2013-11-24 MED ORDER — ONDANSETRON HCL 4 MG/2ML IJ SOLN: 4.0000 mg | Freq: Once | INTRAMUSCULAR | Status: DC | PRN

## 2013-11-24 MED ORDER — MIDAZOLAM HCL 5 MG/5ML IJ SOLN
INTRAMUSCULAR | Status: DC | PRN
  Administered 2013-11-24: 2 mg via INTRAVENOUS

## 2013-11-24 MED ORDER — PROPOFOL 10 MG/ML IV BOLUS
INTRAVENOUS | Status: AC
  Filled 2013-11-24: qty 20

## 2013-11-24 MED ORDER — LIDOCAINE HCL 1 % IJ SOLN
INTRAMUSCULAR | Status: DC | PRN
  Administered 2013-11-24: 40 mg via INTRADERMAL

## 2013-11-24 MED ORDER — SUMATRIPTAN SUCCINATE 50 MG PO TABS
100.0000 mg | ORAL_TABLET | ORAL | Status: DC | PRN
  Filled 2013-11-24: qty 1

## 2013-11-24 MED ORDER — LACTATED RINGERS IV SOLN
INTRAVENOUS | Status: DC
  Administered 2013-11-24 (×3): via INTRAVENOUS

## 2013-11-24 MED ORDER — BUPIVACAINE LIPOSOME 1.3 % IJ SUSP
INTRAMUSCULAR | Status: AC
  Filled 2013-11-24: qty 20

## 2013-11-24 MED ORDER — PANTOPRAZOLE SODIUM 40 MG PO TBEC: 40.0000 mg | DELAYED_RELEASE_TABLET | Freq: Every day | ORAL | Status: DC

## 2013-11-24 MED ORDER — KETAMINE HCL 10 MG/ML IJ SOLN
INTRAMUSCULAR | Status: AC
  Filled 2013-11-24: qty 1

## 2013-11-24 MED ORDER — BIOTIN 1000 MCG PO TABS: 1000.0000 ug | ORAL_TABLET | Freq: Three times a day (TID) | ORAL | Status: DC

## 2013-11-24 MED ORDER — MIDAZOLAM HCL 2 MG/2ML IJ SOLN
1.0000 mg | INTRAMUSCULAR | Status: DC | PRN
  Administered 2013-11-24: 2 mg via INTRAVENOUS

## 2013-11-24 MED ORDER — GLYCOPYRROLATE 0.2 MG/ML IJ SOLN
INTRAMUSCULAR | Status: DC | PRN
  Administered 2013-11-24: 0.4 mg via INTRAVENOUS

## 2013-11-24 MED ORDER — ONDANSETRON HCL 4 MG PO TABS: 8.0000 mg | ORAL_TABLET | Freq: Four times a day (QID) | ORAL | Status: DC | PRN

## 2013-11-24 MED ORDER — BUPIVACAINE LIPOSOME 1.3 % IJ SUSP
INTRAMUSCULAR | Status: DC | PRN
  Administered 2013-11-24: 20 mL

## 2013-11-24 MED ORDER — PROPOFOL 10 MG/ML IV BOLUS
INTRAVENOUS | Status: DC | PRN
  Administered 2013-11-24: 140 mg via INTRAVENOUS

## 2013-11-24 MED ORDER — DOCUSATE SODIUM 100 MG PO CAPS
100.0000 mg | ORAL_CAPSULE | Freq: Two times a day (BID) | ORAL | Status: DC
  Administered 2013-11-24 (×2): 100 mg via ORAL
  Filled 2013-11-24 (×2): qty 1

## 2013-11-24 SURGICAL SUPPLY — 50 items
APPLIER CLIP 13 LRG OPEN (CLIP)
BAG HAMPER (MISCELLANEOUS) ×3 IMPLANT
CLIP APPLIE 13 LRG OPEN (CLIP) IMPLANT
CLOTH BEACON ORANGE TIMEOUT ST (SAFETY) ×3 IMPLANT
COVER LIGHT HANDLE STERIS (MISCELLANEOUS) ×6 IMPLANT
DERMABOND ADVANCED (GAUZE/BANDAGES/DRESSINGS) ×1
DERMABOND ADVANCED .7 DNX12 (GAUZE/BANDAGES/DRESSINGS) ×2 IMPLANT
DRAPE WARM FLUID 44X44 (DRAPE) ×3 IMPLANT
DRSG OPSITE POSTOP 4X10 (GAUZE/BANDAGES/DRESSINGS) ×3 IMPLANT
ELECT REM PT RETURN 9FT ADLT (ELECTROSURGICAL) ×3
ELECTRODE REM PT RTRN 9FT ADLT (ELECTROSURGICAL) ×2 IMPLANT
FORMALIN 10 PREFIL 480ML (MISCELLANEOUS) ×3 IMPLANT
GLOVE BIO SURGEON STRL SZ 6.5 (GLOVE) ×3 IMPLANT
GLOVE BIO SURGEON STRL SZ8 (GLOVE) ×3 IMPLANT
GLOVE BIOGEL PI IND STRL 7.0 (GLOVE) ×4 IMPLANT
GLOVE BIOGEL PI IND STRL 8 (GLOVE) ×2 IMPLANT
GLOVE BIOGEL PI INDICATOR 7.0 (GLOVE) ×2
GLOVE BIOGEL PI INDICATOR 8 (GLOVE) ×1
GLOVE ECLIPSE 8.0 STRL XLNG CF (GLOVE) ×3 IMPLANT
GLOVE EXAM NITRILE PF LG BLUE (GLOVE) ×3 IMPLANT
GLOVE SS BIOGEL STRL SZ 6.5 (GLOVE) ×2 IMPLANT
GLOVE SUPERSENSE BIOGEL SZ 6.5 (GLOVE) ×1
GOWN SRG XL XLNG 56XLVL 4 (GOWN DISPOSABLE) ×2 IMPLANT
GOWN STRL NON-REIN XL XLG LVL4 (GOWN DISPOSABLE) ×1
GOWN STRL REUS W/TWL LRG LVL3 (GOWN DISPOSABLE) ×6 IMPLANT
GOWN STRL REUS W/TWL XL LVL3 (GOWN DISPOSABLE) ×3 IMPLANT
INST SET MAJOR GENERAL (KITS) ×3 IMPLANT
KIT ROOM TURNOVER APOR (KITS) ×3 IMPLANT
MANIFOLD NEPTUNE II (INSTRUMENTS) ×3 IMPLANT
NEEDLE HYPO 21X1 ECLIPSE (NEEDLE) ×3 IMPLANT
NS IRRIG 1000ML POUR BTL (IV SOLUTION) ×6 IMPLANT
PACK ABDOMINAL MAJOR (CUSTOM PROCEDURE TRAY) ×3 IMPLANT
PAD ARMBOARD 7.5X6 YLW CONV (MISCELLANEOUS) ×3 IMPLANT
RETRACTOR WND ALEXIS 25 LRG (MISCELLANEOUS) ×2 IMPLANT
RTRCTR WOUND ALEXIS 25CM LRG (MISCELLANEOUS) ×3
SET BASIN LINEN APH (SET/KITS/TRAYS/PACK) ×3 IMPLANT
STAPLER VISISTAT 35W (STAPLE) ×3 IMPLANT
SUT CHROMIC 0 CT 1 (SUTURE) ×9 IMPLANT
SUT MNCRL+ AB 3-0 CT1 36 (SUTURE) ×2 IMPLANT
SUT MON AB 3-0 SH 27 (SUTURE) IMPLANT
SUT MONOCRYL AB 3-0 CT1 36IN (SUTURE) ×1
SUT PLAIN 2 0 XLH (SUTURE) ×3 IMPLANT
SUT VIC AB 0 CT1 27 (SUTURE) ×3
SUT VIC AB 0 CT1 27XBRD ANTBC (SUTURE) IMPLANT
SUT VIC AB 0 CT1 27XCR 8 STRN (SUTURE) ×6 IMPLANT
SUT VIC AB 0 CTX 36 (SUTURE) ×1
SUT VIC AB 0 CTX36XBRD ANTBCTR (SUTURE) ×2 IMPLANT
SUT VICRYL 3 0 (SUTURE) ×3 IMPLANT
TOWEL BLUE STERILE X RAY DET (MISCELLANEOUS) IMPLANT
TRAY FOLEY CATH 16FR SILVER (SET/KITS/TRAYS/PACK) ×3 IMPLANT

## 2013-11-24 NOTE — Anesthesia Procedure Notes (Signed)
Procedure Name: Intubation Date/Time: 11/24/2013 7:47 AM Performed by: Charmaine Downs Pre-anesthesia Checklist: Emergency Drugs available, Suction available, Patient being monitored and Patient identified Patient Re-evaluated:Patient Re-evaluated prior to inductionOxygen Delivery Method: Circle system utilized Preoxygenation: Pre-oxygenation with 100% oxygen Intubation Type: IV induction and Cricoid Pressure applied Ventilation: Mask ventilation without difficulty and Oral airway inserted - appropriate to patient size Laryngoscope Size: Mac and 3 Grade View: Grade II Tube type: Oral Tube size: 7.0 mm Number of attempts: 1 Airway Equipment and Method: Stylet Placement Confirmation: ETT inserted through vocal cords under direct vision,  positive ETCO2 and breath sounds checked- equal and bilateral Secured at: 22 cm Tube secured with: Tape Dental Injury: Teeth and Oropharynx as per pre-operative assessment

## 2013-11-24 NOTE — Anesthesia Postprocedure Evaluation (Signed)
  Anesthesia Post-op Note  Patient: Sara Pruitt  Procedure(s) Performed: Procedure(s): HYSTERECTOMY SUPRACERVICAL ABDOMINAL (N/A) BILATERAL SALPINGECTOMY (Bilateral)  Patient Location: PACU  Anesthesia Type:General  Level of Consciousness: awake, alert , oriented and patient cooperative  Airway and Oxygen Therapy: Patient Spontanous Breathing  Post-op Pain: 4 /10, moderate  Post-op Assessment: Post-op Vital signs reviewed, Patient's Cardiovascular Status Stable, Respiratory Function Stable, Patent Airway and Pain level controlled  Post-op Vital Signs: Reviewed and stable  Last Vitals:  Filed Vitals:   11/24/13 1030  BP:   Pulse: 103  Temp:   Resp: 14    Complications: No apparent anesthesia complications

## 2013-11-24 NOTE — H&P (Signed)
Preoperative History and Physical  Sara Pruitt is a 38 y.o. G1P1 with No LMP recorded. Patient is not currently having periods (Reason: IUD). admitted for a abdominal supracervical hysterectomy .  Pt has enlarged fibroid uterus 14-16 weeks with heavy menstrual periods.  PMH:  Past Medical History   Diagnosis  Date   .  Constipation      ? IBS   .  GERD (gastroesophageal reflux disease)    .  S/P colonoscopy  June 07, 2010     Dr. Gala Romney: secondary to constipation, normal. 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  Abnormal pap    .  Fibroids      uterine   .  Migraines    PSH:  Past Surgical History   Procedure  Laterality  Date   .  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  Toe surgery     POb/GynH:  OB History    Grav  Para  Term  Preterm  Abortions  TAB  SAB  Ect  Mult  Living    1  1             SH:  History   Substance Use Topics   .  Smoking status:  Never Smoker   .  Smokeless tobacco:  Never Used   .  Alcohol Use:  No   FH:  Family History   Problem  Relation  Age of Onset   .  Alcohol abuse  Father    .  Hypertension  Father    .  Cancer  Mother      colon   .  Hypertension  Mother    .  Fibroids  Mother    .  Hypertension  Brother    .  Stroke  Maternal Uncle    .  Hypertension  Maternal Grandmother    .  Fibroids  Maternal Grandmother    .  Hypertension  Paternal Grandmother    Allergies:  Allergies   Allergen  Reactions   .  Sulfonamide Derivatives    Medications: Current outpatient prescriptions:Biotin 1000 MCG tablet, Take 1,000 mcg by mouth 3 (three) times daily., Disp: , Rfl: ; dexlansoprazole (DEXILANT) 60 MG capsule, Take 60 mg by mouth daily., Disp: , Rfl: ; HYDROcodone-acetaminophen (NORCO/VICODIN) 5-325 MG per tablet, Take 1 tablet by mouth every 4 (four) hours as needed., Disp: 15 tablet, Rfl: 0; L-Lysine 1000 MG TABS, Take 1,000 mg by mouth daily. , Disp: , Rfl:  levonorgestrel (MIRENA) 20 MCG/24HR IUD, 1 each by Intrauterine route once.  , Disp: , Rfl: ; megestrol (MEGACE) 40 MG tablet, Take 40 mg by mouth daily., Disp: , Rfl: ; mirabegron ER (MYRBETRIQ) 50 MG TB24 tablet, Take 1 tablet (50 mg total) by mouth daily., Disp: 30 tablet, Rfl: 11; Multiple Vitamin (MULTIVITAMIN) tablet, Take 1 tablet by mouth daily., Disp: , Rfl:  SUMAtriptan (IMITREX) 100 MG tablet, Take 100 mg by mouth every 2 (two) hours as needed for migraine or headache. May repeat in 2 hours if headache persists or recurs., Disp: , Rfl: ; cyclobenzaprine (FLEXERIL) 5 MG tablet, Take 1 tablet (5 mg total) by mouth 3 (three) times daily as needed for muscle spasms., Disp: 30 tablet, Rfl: 0; meloxicam (MOBIC) 7.5 MG tablet, Take 1 tablet (7.5 mg total) by mouth daily., Disp: 10 tablet, Rfl: 0  Review of Systems:  Review of Systems  Constitutional: Negative for fever, chills, weight loss, malaise/fatigue  and diaphoresis.  HENT: Negative for hearing loss, ear pain, nosebleeds, congestion, sore throat, neck pain, tinnitus and ear discharge.  Eyes: Negative for blurred vision, double vision, photophobia, pain, discharge and redness.  Respiratory: Negative for cough, hemoptysis, sputum production, shortness of breath, wheezing and stridor.  Cardiovascular: Negative for chest pain, palpitations, orthopnea, claudication, leg swelling and PND.  Gastrointestinal: Positive for abdominal pain. Negative for heartburn, nausea, vomiting, diarrhea, constipation, blood in stool and melena.  Genitourinary: Negative for dysuria, urgency, frequency, hematuria and flank pain.  Musculoskeletal: Negative for myalgias, back pain, joint pain and falls.  Skin: Negative for itching and rash.  Neurological: Negative for dizziness, tingling, tremors, sensory change, speech change, focal weakness, seizures, loss of consciousness, weakness and headaches.  Endo/Heme/Allergies: Negative for environmental allergies and polydipsia. Does not bruise/bleed easily.  Psychiatric/Behavioral: Negative for  depression, suicidal ideas, hallucinations, memory loss and substance abuse. The patient is not nervous/anxious and does not have insomnia.  PHYSICAL EXAM:  Blood pressure 120/70, height 5\' 7"  (1.702 m), weight 188 lb (85.276 kg).  Vitals reviewed.  Constitutional: She is oriented to person, place, and time. She appears well-developed and well-nourished.  HENT:  Head: Normocephalic and atraumatic.  Right Ear: External ear normal.  Left Ear: External ear normal.  Nose: Nose normal.  Mouth/Throat: Oropharynx is clear and moist.  Eyes: Conjunctivae and EOM are normal. Pupils are equal, round, and reactive to light. Right eye exhibits no discharge. Left eye exhibits no discharge. No scleral icterus.  Neck: Normal range of motion. Neck supple. No tracheal deviation present. No thyromegaly present.  Cardiovascular: Normal rate, regular rhythm, normal heart sounds and intact distal pulses. Exam reveals no gallop and no friction rub.  No murmur heard.  Respiratory: Effort normal and breath sounds normal. No respiratory distress. She has no wheezes. She has no rales. She exhibits no tenderness.  GI: Soft. Bowel sounds are normal. She exhibits no distension and no mass. There is tenderness. There is no rebound and no guarding.  Genitourinary:  Vulva is normal without lesions Vagina is pink moist without discharge Cervix normal in appearance and pap is normal Uterus is enlarged to 14-16 weeks size due to multiple fibroids the largest of which is 6.1 cm Adnexa is negative with normal sized ovaries by sonogram  Musculoskeletal: Normal range of motion. She exhibits no edema and no tenderness.  Neurological: She is alert and oriented to person, place, and time. She has normal reflexes. She displays normal reflexes. No cranial nerve deficit. She exhibits normal muscle tone. Coordination normal.  Skin: Skin is warm and dry. No rash noted. No erythema. No pallor.  Psychiatric: She has a normal mood and  affect. Her behavior is normal. Judgment and thought content normal.  Labs:  No results found for this or any previous visit (from the past 336 hour(s)).  EKG:  Orders placed during the hospital encounter of 09/29/13   .  ED EKG   .  ED EKG   .  EKG 12-LEAD   .  EKG 12-LEAD   .  EKG   Imaging Studies:  No results found.  Assessment:  Patient Active Problem List    Diagnosis  Date Noted   .  Menometrorrhagia  10/01/2013   .  Dysmenorrhea  10/01/2013   .  Fibroid uterus  09/14/2012   .  Cervical dysplasia , Tx'd LEEP  09/14/2012   .  GERD  05/03/2010   .  CONSTIPATION  05/03/2010   Plan:  Abdominal  supracervical hysterectomy with removal of IUD    Dywane Peruski H 11/24/2013 7:13 AM

## 2013-11-24 NOTE — Anesthesia Preprocedure Evaluation (Signed)
Anesthesia Evaluation  Patient identified by MRN, date of birth, ID band Patient awake    Reviewed: Allergy & Precautions, H&P , NPO status , Patient's Chart, lab work & pertinent test results  History of Anesthesia Complications Negative for: history of anesthetic complications  Airway Mallampati: II TM Distance: >3 FB Neck ROM: Full    Dental  (+) Teeth Intact   Pulmonary  breath sounds clear to auscultation        Cardiovascular negative cardio ROS  Rhythm:Regular Rate:Normal     Neuro/Psych  Headaches,    GI/Hepatic GERD-  Medicated and Controlled,  Endo/Other    Renal/GU      Musculoskeletal   Abdominal   Peds  Hematology  (+) Sickle cell trait ,   Anesthesia Other Findings   Reproductive/Obstetrics                           Anesthesia Physical Anesthesia Plan  ASA: II  Anesthesia Plan: General   Post-op Pain Management:    Induction: Intravenous, Rapid sequence and Cricoid pressure planned  Airway Management Planned: Oral ETT  Additional Equipment:   Intra-op Plan:   Post-operative Plan: Extubation in OR  Informed Consent: I have reviewed the patients History and Physical, chart, labs and discussed the procedure including the risks, benefits and alternatives for the proposed anesthesia with the patient or authorized representative who has indicated his/her understanding and acceptance.     Plan Discussed with:   Anesthesia Plan Comments:         Anesthesia Quick Evaluation

## 2013-11-24 NOTE — Progress Notes (Signed)
PHARMACIST - PHYSICIAN ORDER COMMUNICATION  CONCERNING: P&T Medication Policy on Herbal Medications  DESCRIPTION:  This patient's order for:  Biotin, Lysine  has been noted.  This product(s) is classified as an "herbal" or natural product. Due to a lack of definitive safety studies or FDA approval, nonstandard manufacturing practices, plus the potential risk of unknown drug-drug interactions while on inpatient medications, the Pharmacy and Therapeutics Committee does not permit the use of "herbal" or natural products of this type within [facility].   ACTION TAKEN: The pharmacy department is unable to verify this order at this time and your patient has been informed of this safety policy. Please reevaluate patient's clinical condition at discharge and address if the herbal or natural product(s) should be resumed at that time.  Netta Cedars, PharmD, BCPS 11/24/2013@2 :19 PM

## 2013-11-24 NOTE — Op Note (Signed)
Preoperative diagnosis:  1.  Enlarged uterus, fibroids 16 weeks size                                          2.  Menorrhagia                                         3.  Pelvic pain                                         4.  IUD  Postoperative diagnosis:  Same as above   Procedure:  Abdominal hysterectomy, supracervical, removal of both tubes, bilateral salpingectomy, failed IUD removal  Surgeon:  Florian Buff  Assistant:    Anesthesia:  General endotracheal  Preoperative clinical summary:  Pt with increasingly symptomatic fibroid uterus, 16 weeks size. Desires supracervical management  Intraoperative findings: normal ovaries and tubes, multiple fibroid uterus  Description of operation:  Patient was taken to the operating room and placed in the supine position where she underwent general endotracheal anesthesia.  She was then prepped and draped in the usual sterile fashion and a Foley catheter was placed for continuous bladder drainage.  A Pfannenstiel skin incision was made and carried down sharply to the rectus fascia which was scored in the midline and extended laterally.  The fascia was taken off the muscles superiorly and inferiorly without difficulty.  The muscles were divided.  The peritoneal cavity was entered.  An medium Alexis self-retaining retractor was placed.  The upper abdomen was packed away. Both uterine cornu were grasped with Coker clamps.  The left round ligament was suture ligated and coagulated with the electrocautery unit.  The left vesicouterine serosal flap was created.  An avascular window in in the peritoneum was created and the utero-ovarian ligament was cross clamped, cut and suture ligated.  The right round ligament was suture ligated and cut with the electrocautery unit.  The vesicouterine serosal flap on the right was created.  An avascular window in the peritoneum was created and the right utero-ovarian ligament was cross clamped, cut and double suture ligated.   Thus both ovaries were preserved.  The uterine vessels were skeletonized bilaterally.  The uterine vessels were clamped bilaterally,  then cut and suture ligated.  Two more pedicles were taken down the cervix medial to the uterine vessels.  Each pedicle was clamped cut and suture ligated with good resulting hemostasis.  As per the preoperative plan the cervix was then transected sharply and the specimen was removed.  The cervical stump was then closed anterior to posterior for hemostasis and reduce postoperative adhesions.  The pelvis was irrigated vigorously and all pedicles were examined and found to be hemostatic.  Seprafilm was then placed in the pelvis to reduce postoperative adhesion formation.  All specimens were sent to pathology for routine evaluation.  The pelvic peritoneum was closed from ovary to ovary, anterior to posterior.   The Alexis self-retaining retractor was removed and the pelvis was irrigated vigorously.  All packs were removed and all counts were correct at this point x 3.  The muscles and peritoneum were reapproximated loosely.  The fascia was closed with 0 Vicryl running.  The subcutaneous  tissue was reapproximated using 2-0 plain gut.  The skin was closed using 3-0 Vicryl on a Keith needle in a subcuticular fashion.  Liquidband was then applied for additional wound integrity and to serve as a postoperative bacterial barrier.  The patient was awakened from anesthesia taken to the recovery room in good stable condition. All sponge instrument and needle counts were correct x 3.  The patient received Ancef and Toradol prophylactically preoperatively.  Estimated blood loss for the procedure was 250  cc.  EURE,LUTHER H 11/24/2013 10:27 AM

## 2013-11-24 NOTE — Progress Notes (Signed)
Pt repeatedly asking for foley to be removed.  Placed call to MD and he agreed to remove.  Pt ambulated to bathroom but was not able to urinate at this time.

## 2013-11-24 NOTE — Progress Notes (Signed)
Monitoring patient till bed available

## 2013-11-24 NOTE — Transfer of Care (Signed)
Immediate Anesthesia Transfer of Care Note  Patient: Sara Pruitt  Procedure(s) Performed: Procedure(s): HYSTERECTOMY SUPRACERVICAL ABDOMINAL (N/A) BILATERAL SALPINGECTOMY (Bilateral)  Patient Location: PACU  Anesthesia Type:General  Level of Consciousness: awake and patient cooperative  Airway & Oxygen Therapy: Patient Spontanous Breathing and Patient connected to face mask oxygen  Post-op Assessment: Report given to PACU RN, Post -op Vital signs reviewed and stable and Patient moving all extremities  Post vital signs: Reviewed and stable  Complications: No apparent anesthesia complications

## 2013-11-25 DIAGNOSIS — D259 Leiomyoma of uterus, unspecified: Secondary | ICD-10-CM | POA: Diagnosis not present

## 2013-11-25 LAB — BASIC METABOLIC PANEL
Anion gap: 10 (ref 5–15)
BUN: 10 mg/dL (ref 6–23)
CALCIUM: 9 mg/dL (ref 8.4–10.5)
CO2: 27 mEq/L (ref 19–32)
Chloride: 106 mEq/L (ref 96–112)
Creatinine, Ser: 1.02 mg/dL (ref 0.50–1.10)
GFR calc Af Amer: 82 mL/min — ABNORMAL LOW (ref >90)
GFR calc non Af Amer: 70 mL/min — ABNORMAL LOW (ref >90)
GLUCOSE: 110 mg/dL — AB (ref 70–99)
POTASSIUM: 3.7 meq/L (ref 3.7–5.3)
Sodium: 143 mEq/L (ref 137–147)

## 2013-11-25 LAB — CBC
HEMATOCRIT: 34 % — AB (ref 36.0–46.0)
HEMOGLOBIN: 12 g/dL (ref 12.0–15.0)
MCH: 29.6 pg (ref 26.0–34.0)
MCHC: 35.3 g/dL (ref 30.0–36.0)
MCV: 84 fL (ref 78.0–100.0)
Platelets: 270 10*3/uL (ref 150–400)
RBC: 4.05 MIL/uL (ref 3.87–5.11)
RDW: 13.6 % (ref 11.5–15.5)
WBC: 10.7 10*3/uL — ABNORMAL HIGH (ref 4.0–10.5)

## 2013-11-25 MED ORDER — KETOROLAC TROMETHAMINE 10 MG PO TABS: 10.0000 mg | ORAL_TABLET | Freq: Three times a day (TID) | ORAL | Status: DC | PRN

## 2013-11-25 MED ORDER — OXYCODONE-ACETAMINOPHEN 5-325 MG PO TABS: 1.0000 | ORAL_TABLET | ORAL | Status: DC | PRN

## 2013-11-25 MED ORDER — ONDANSETRON HCL 8 MG PO TABS: 8.0000 mg | ORAL_TABLET | Freq: Four times a day (QID) | ORAL | Status: DC | PRN

## 2013-11-25 NOTE — Discharge Summary (Signed)
Physician Discharge Summary  Patient ID: Sara Pruitt MRN: [id number] DOB/AGE: 1975-05-04 38 y.o.  Admit date: 11/24/2013 Discharge date: 11/25/2013  Admission Diagnoses: fibroids Discharge Diagnoses:  S/p hysterectomy no complications  Discharged Condition: good  Hospital Course: unremarkable  Consults: None  Significant Diagnostic Studies: labs: cbc, bmp  Treatments: surgery: hysterectomy  Discharge Exam: Blood pressure 149/91, pulse 67, temperature 99 F (37.2 C), temperature source Oral, resp. rate 18, height 5\' 7"  (1.702 m), weight 175 lb 14.4 oz (79.788 kg), SpO2 99.00%. General appearance: alert, cooperative and no distress GI: soft, non-tender; bowel sounds normal; no masses,  no organomegaly Incision/Wound:clean dry intact  Disposition: 01-Home or Self Care  Discharge Instructions   Diet - low sodium heart healthy    Complete by:  As directed      Discharge wound care:    Complete by:  As directed   Keep dressing on     Driving Restrictions    Complete by:  As directed   No driving for 1 week     Increase activity slowly    Complete by:  As directed      Lifting restrictions    Complete by:  As directed   No more than 8 pounds     Sexual Activity Restrictions    Complete by:  As directed   Are you kidding?            Medication List    STOP taking these medications       megestrol 40 MG tablet  Commonly known as:  MEGACE     MIRENA 20 MCG/24HR IUD  Generic drug:  levonorgestrel      TAKE these medications       Biotin 1000 MCG tablet  Take 1,000 mcg by mouth 3 (three) times daily.     dexlansoprazole 60 MG capsule  Commonly known as:  DEXILANT  Take 60 mg by mouth daily.     ketorolac 10 MG tablet  Commonly known as:  TORADOL  Take 1 tablet (10 mg total) by mouth every 8 (eight) hours as needed.     Lysine 500 MG Tabs  Take 2 tablets by mouth 3 (three) times daily.     mirabegron ER 50 MG Tb24 tablet  Commonly known as:   MYRBETRIQ  Take 50 mg by mouth daily.     multivitamin tablet  Take 1 tablet by mouth daily.     ondansetron 8 MG tablet  Commonly known as:  ZOFRAN  Take 1 tablet (8 mg total) by mouth every 6 (six) hours as needed for nausea.     oxyCODONE-acetaminophen 5-325 MG per tablet  Commonly known as:  PERCOCET/ROXICET  Take 1-2 tablets by mouth every 4 (four) hours as needed for severe pain (moderate to severe pain (when tolerating fluids)).     SUMAtriptan 100 MG tablet  Commonly known as:  IMITREX  Take 100 mg by mouth every 2 (two) hours as needed for migraine or headache. May repeat in 2 hours if headache persists or recurs.           Follow-up Information   Follow up with Florian Buff, MD In 1 week.   Specialties:  Obstetrics and Gynecology, Radiology   Contact information:   [place] [id number] [phone]       Signed: Florian Buff 11/25/2013, 9:13 AM

## 2013-11-25 NOTE — Progress Notes (Signed)
Pt states that IV is hurting her wrist and her hand are swelling.  Placed call to the MD and per verbal order MD stated to remove the IV and she did not require a new IV.  Pt aware that she will not have access to IV pain medication and pt is agreeable to the removal of the IV.

## 2013-11-25 NOTE — Discharge Instructions (Signed)
hyst Abdominal Hysterectomy, Care After Refer to this sheet in the next few weeks. These instructions provide you with information on caring for yourself after your procedure. Your health care provider may also give you more specific instructions. Your treatment has been planned according to current medical practices, but problems sometimes occur. Call your health care provider if you have any problems or questions after your procedure.  WHAT TO EXPECT AFTER THE PROCEDURE After your procedure, it is typical to have the following:  Pain.  Feeling tired.  Poor appetite.  Less interest in sex. HOME CARE INSTRUCTIONS  It takes 4-6 weeks to recover from this surgery. Make sure you follow all your health care provider's instructions. Home care instructions may include:  Take pain medicines only as directed by your health care provider. Do not take over-the-counter pain medicines without checking with your health care provider first.  Change your bandage as directed by your health care provider.  Return to your health care provider to have your sutures taken out.  Take showers instead of baths for 2-3 weeks. Ask your health care provider when it is safe to start showering.  Do not douche, use tampons, or have sexual intercourse for at least 6 weeks or until your health care provider says you can.   Follow your health care provider's advice about exercise, lifting, driving, and general activities.  Get plenty of rest and sleep.   Do not lift anything heavier than a gallon of milk (about 10 lb [4.5 kg]) for the first month after surgery.  You can resume your normal diet if your health care provider says it is okay.   Do not drink alcohol until your health care provider says you can.   If you are constipated, ask your health care provider if you can take a mild laxative.  Eating foods high in fiber may also help with constipation. Eat plenty of raw fruits and vegetables, whole  grains, and beans.  Drink enough fluids to keep your urine clear or pale yellow.   Try to have someone at home with you for the first 1-2 weeks to help around the house.  Keep all follow-up appointments. SEEK MEDICAL CARE IF:   You have chills or fever.  You have swelling, redness, or pain in the area of your incision that is getting worse.   You have pus coming from the incision.   You notice a bad smell coming from the incision or bandage.   Your incision breaks open.   You feel dizzy or light-headed.   You have pain or bleeding when you urinate.   You have persistent diarrhea.   You have persistent nausea and vomiting.   You have abnormal vaginal discharge.   You have a rash.   You have any type of abnormal reaction or develop an allergy to your medicine.   Your pain medicine is not helping.  SEEK IMMEDIATE MEDICAL CARE IF:   You have a fever and your symptoms suddenly get worse.  You have severe abdominal pain.  You have chest pain.  You have shortness of breath.  You faint.  You have pain, swelling, or redness of your leg.  You have heavy vaginal bleeding with blood clots. MAKE SURE YOU:  Understand these instructions.  Will watch your condition.  Will get help right away if you are not doing well or get worse. Document Released: 11/02/2004 Document Revised: 04/20/2013 Document Reviewed: 02/05/2013 [facility] Patient Information 2015 [place], [place]. This information is not  intended to replace advice given to you by your health care provider. Make sure you discuss any questions you have with your health care provider. ° °

## 2013-11-25 NOTE — Progress Notes (Signed)
Pt states she ambulated last night and has been up to the bathroom several time throughout the night.  Pt has no c/o nausea but states she is still in some pain.  Pt refusing IV to be restarted to help with pain control and states she does not want the IV back in at all.

## 2013-11-26 ENCOUNTER — Encounter (HOSPITAL_COMMUNITY): Payer: Self-pay | Admitting: Obstetrics & Gynecology

## 2013-11-29 ENCOUNTER — Telehealth: Payer: Self-pay | Admitting: Adult Health

## 2013-11-29 NOTE — Telephone Encounter (Signed)
Had surgery with Dr. Elonda Husky 11/24/2013 (supracervical hysterectomy) has not had a BM since. Per Dr. Elonda Husky pt to use Dulcolax suppository if no improvement to call office back. Pt verbalized understanding.

## 2013-12-02 ENCOUNTER — Ambulatory Visit (INDEPENDENT_AMBULATORY_CARE_PROVIDER_SITE_OTHER): Payer: Self-pay | Admitting: Obstetrics & Gynecology

## 2013-12-02 ENCOUNTER — Encounter: Payer: Self-pay | Admitting: Obstetrics & Gynecology

## 2013-12-02 VITALS — BP 100/60 | Wt 183.0 lb

## 2013-12-02 DIAGNOSIS — Z9889 Other specified postprocedural states: Secondary | ICD-10-CM

## 2013-12-02 NOTE — Progress Notes (Signed)
Patient ID: Sara Pruitt, female   DOB: 04-29-1976, 38 y.o.   MRN: [id number] 1 week post op from supracervical hysterectomy Muscles had to be partially cut Incision clean dry intact liquiban is flaking off  Follow up 4 weeks

## 2013-12-07 ENCOUNTER — Telehealth: Payer: Self-pay | Admitting: Obstetrics & Gynecology

## 2013-12-07 NOTE — Telephone Encounter (Signed)
Pt c/o pain and hardness at incision site, "hurts to move." Pain rated at 8.5 on 1-10 scale. Pt states taking percocet 5/325mg  but not on a regular bases because make her sleepy. Pt states "can tolerate pain but concerned about the hardness at site."

## 2013-12-08 NOTE — Telephone Encounter (Signed)
Normal healing response not a concern, don't "mess" with the incision, keep your fingers off of it

## 2013-12-08 NOTE — Telephone Encounter (Signed)
Pt informed Dr. Elonda Husky recommendation "don't mess with incision site, keep fingers off. Normal healing response.

## 2013-12-30 ENCOUNTER — Ambulatory Visit (INDEPENDENT_AMBULATORY_CARE_PROVIDER_SITE_OTHER): Payer: BC Managed Care – PPO | Admitting: Obstetrics & Gynecology

## 2013-12-30 ENCOUNTER — Encounter: Payer: Self-pay | Admitting: Obstetrics & Gynecology

## 2013-12-30 VITALS — BP 120/80 | Ht 67.0 in | Wt 183.0 lb

## 2013-12-30 DIAGNOSIS — Z9889 Other specified postprocedural states: Secondary | ICD-10-CM

## 2013-12-30 DIAGNOSIS — Z90710 Acquired absence of both cervix and uterus: Secondary | ICD-10-CM

## 2013-12-30 NOTE — Progress Notes (Signed)
Patient ID: Sara Pruitt, female   DOB: 1976-03-15, 38 y.o.   MRN: [id number] 5 weeks post op from a abdominal supracervical hysterectomy for fibroids No complaints doing well  Blood pressure 120/80, height 5\' 7"  (1.702 m), weight 183 lb (83.008 kg).  Abdomen soft non tender Incision clean dry intact Cervix intact No midline masses palpable  Follow up 1 year

## 2014-02-28 ENCOUNTER — Encounter: Payer: Self-pay | Admitting: Obstetrics & Gynecology

## 2014-07-20 ENCOUNTER — Encounter: Payer: Self-pay | Admitting: *Deleted

## 2014-07-26 ENCOUNTER — Ambulatory Visit: Payer: Self-pay | Admitting: Obstetrics & Gynecology

## 2014-07-29 ENCOUNTER — Ambulatory Visit (INDEPENDENT_AMBULATORY_CARE_PROVIDER_SITE_OTHER): Payer: 59 | Admitting: Obstetrics & Gynecology

## 2014-07-29 ENCOUNTER — Encounter: Payer: Self-pay | Admitting: Obstetrics & Gynecology

## 2014-07-29 VITALS — BP 128/80 | HR 60 | Ht 67.0 in | Wt 202.0 lb

## 2014-07-29 DIAGNOSIS — B9689 Other specified bacterial agents as the cause of diseases classified elsewhere: Secondary | ICD-10-CM

## 2014-07-29 DIAGNOSIS — N760 Acute vaginitis: Secondary | ICD-10-CM | POA: Diagnosis not present

## 2014-07-29 DIAGNOSIS — N930 Postcoital and contact bleeding: Secondary | ICD-10-CM

## 2014-07-29 DIAGNOSIS — A499 Bacterial infection, unspecified: Secondary | ICD-10-CM

## 2014-07-29 MED ORDER — MEDROXYPROGESTERONE ACETATE 2.5 MG PO TABS: 2.5000 mg | ORAL_TABLET | Freq: Every day | ORAL | Status: DC

## 2014-07-29 MED ORDER — METRONIDAZOLE 500 MG PO TABS: 500.0000 mg | ORAL_TABLET | Freq: Two times a day (BID) | ORAL | Status: DC

## 2014-07-29 NOTE — Progress Notes (Signed)
Patient ID: Sara Pruitt, female   DOB: 06-16-1975, 39 y.o.   MRN: [id number]   Chief Complaint  Patient presents with  . Referral    referral for dysfunctional uterine bleeding, vaginal discharge     HPI:    39 y.o. G1P1 No LMP recorded. Patient has had a hysterectomy.  S/p abdominal supracervical hysterectomy Also with a malodorous vaginal discharge    Current outpatient prescriptions:  .  Biotin 1000 MCG tablet, Take 1,000 mcg by mouth daily. , Disp: , Rfl:  .  Multiple Vitamin (MULTIVITAMIN) tablet, Take 1 tablet by mouth daily., Disp: , Rfl:  .  Probiotic Product (PROBIOTIC DAILY) CAPS, Take by mouth daily., Disp: , Rfl:  .  SUMAtriptan (IMITREX) 100 MG tablet, Take 100 mg by mouth every 2 (two) hours as needed for migraine or headache. May repeat in 2 hours if headache persists or recurs., Disp: , Rfl:  .  dexlansoprazole (DEXILANT) 60 MG capsule, Take 60 mg by mouth daily., Disp: , Rfl:  .  ketorolac (TORADOL) 10 MG tablet, Take 1 tablet (10 mg total) by mouth every 8 (eight) hours as needed. (Patient not taking: Reported on 07/29/2014), Disp: 15 tablet, Rfl: 0 .  Lysine 500 MG TABS, Take 2 tablets by mouth 3 (three) times daily., Disp: , Rfl:  .  mirabegron ER (MYRBETRIQ) 50 MG TB24 tablet, Take 50 mg by mouth daily., Disp: , Rfl:  .  ondansetron (ZOFRAN) 8 MG tablet, Take 1 tablet (8 mg total) by mouth every 6 (six) hours as needed for nausea. (Patient not taking: Reported on 07/29/2014), Disp: 20 tablet, Rfl: 0 .  oxyCODONE-acetaminophen (PERCOCET/ROXICET) 5-325 MG per tablet, Take 1-2 tablets by mouth every 4 (four) hours as needed for severe pain (moderate to severe pain (when tolerating fluids)). (Patient not taking: Reported on 07/29/2014), Disp: 30 tablet, Rfl: 0  Problem Pertinent ROS:       No burning with urination, frequency or urgency No nausea, vomiting or diarrhea Nor fever chills or other constitutional symptoms   Extended ROS:        [place]:              Past Medical History  Diagnosis Date  . Constipation     ? IBS  . GERD (gastroesophageal reflux disease)   . S/P colonoscopy June 07, 2010    Dr. Gala Romney: secondary to constipation, normal. 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 Abnormal pap   . Fibroids     uterine  . Migraines   . Chronic fatigue   . COPD (chronic obstructive pulmonary disease)   . Sickle cell trait   . History of anemia     Past Surgical History  Procedure Laterality Date  . 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 Toe surgery Bilateral     removal of partial bone of bilateral small toes  . Supracervical abdominal hysterectomy N/A 11/24/2013    Procedure: HYSTERECTOMY SUPRACERVICAL ABDOMINAL;  Surgeon: Florian Buff, MD;  Location: [facility];  Service: Gynecology;  Laterality: N/A;  . Bilateral salpingectomy Bilateral 11/24/2013    Procedure: BILATERAL SALPINGECTOMY;  Surgeon: Florian Buff, MD;  Location: [facility];  Service: Gynecology;  Laterality: Bilateral;    OB History    Gravida Para Term Preterm AB TAB SAB Ectopic Multiple Living   1 1              Allergies  Allergen Reactions  . Strawberry Itching    Tongue  itching  . Sulfonamide Derivatives     History   Social History  . Marital Status: Single    Spouse Name: N/A  . Number of Children: N/A  . Years of Education: N/A   Social History Main Topics  . Smoking status: Never Smoker   . Smokeless tobacco: Never Used  . Alcohol Use: No  . Drug Use: No  . Sexual Activity:    Partners: Male    Birth Control/ Protection: Surgical   Other Topics Concern  . None   Social History Narrative    Family History  Problem Relation Age of Onset  . Alcohol abuse Father   . Hypertension Father   . Cancer Mother     colon  . Hypertension Mother   . Fibroids Mother   . Hypertension Brother   . Stroke Maternal Uncle   . Hypertension Maternal Grandmother   . Fibroids Maternal Grandmother   . Hypertension Paternal Grandmother       Examination:  Vitals:  Blood pressure 128/80, pulse 60, height 5\' 7"  (1.702 m), weight 202 lb (91.627 kg).    Physical Examination:     Abdomen:  Soft, non-tender, normal bowel sounds; no bruits, organomegaly or masses. Vulva:  NEFG, normal appearing vulva with no masses, tenderness or lesions,  Vagina:  normal mucosa, thin grey discharge,   Cervix no lesions no blood  Wet Prep:   A sample of vaginal discharge was obtained from the posterior fornix using a cotton swab. 2 drops of saline were placed on a slide and the cotton swab was immersed in the saline. Microscopic evaluation was performed and results were as follows:  Negative  for yeast  Positive for clue cells , consistent with Bacterial vaginosis Negative for trichomonas  Normal WBC population   Whiff test: Positive    DATA orders and reviews: Labs were ordered today:  Wet prep Imaging studies were not ordered today:    Lab tests were not reviewed today:    Imaging studies were not reviewed today:    I did independently review/view images, tracing or specimen(not simply the report) myself.  Prescription Drug Management:  New Prescriptions: provera 2.5 mg daily, metronidazole 500 bid Renewed Prescriptions:   Current prescription changes:     Impression/Plan(Problem Based): 1.  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  BV      (new problem:) : Additional workup is not needed:  Flagyl 500 bid  {

## 2014-10-05 ENCOUNTER — Ambulatory Visit (HOSPITAL_COMMUNITY)
Admission: RE | Admit: 2014-10-05 | Discharge: 2014-10-05 | Disposition: A | Discharge status: Home / Self Care | Payer: 59 | Source: Ambulatory Visit | Attending: Family Medicine | Admitting: Family Medicine

## 2014-10-05 ENCOUNTER — Other Ambulatory Visit (HOSPITAL_COMMUNITY): Payer: Self-pay | Admitting: Family Medicine

## 2014-10-05 DIAGNOSIS — M25572 Pain in left ankle and joints of left foot: Secondary | ICD-10-CM | POA: Diagnosis not present

## 2014-10-05 DIAGNOSIS — M25472 Effusion, left ankle: Secondary | ICD-10-CM | POA: Insufficient documentation

## 2014-11-29 ENCOUNTER — Telehealth: Payer: Self-pay | Admitting: *Deleted

## 2014-11-29 NOTE — Telephone Encounter (Signed)
Informed pt discussed her concerns with Derrek Monaco, NP, states pt needs an appt. Pt states she is unable to make an appt at this time due to her insurance required a referral from her PCP. Pt informed Dr. Elonda Husky on vacation until next Tuesday will discuss with him at that time and call pt back since Dr. Elonda Husky is who this pt sees. Pt verbalized understanding.

## 2014-12-17 NOTE — Telephone Encounter (Signed)
I would say these are possible with higher dose provera but 2.5 mg generally does not cause any adverse affects

## 2016-02-25 ENCOUNTER — Emergency Department (HOSPITAL_COMMUNITY)
Admission: EM | Admit: 2016-02-25 | Discharge: 2016-02-25 | Disposition: A | Discharge status: Home / Self Care | Payer: Self-pay | Attending: Emergency Medicine | Admitting: Emergency Medicine

## 2016-02-25 ENCOUNTER — Encounter (HOSPITAL_COMMUNITY): Payer: Self-pay

## 2016-02-25 ENCOUNTER — Emergency Department (HOSPITAL_COMMUNITY): Payer: Self-pay

## 2016-02-25 ENCOUNTER — Other Ambulatory Visit: Payer: Self-pay

## 2016-02-25 DIAGNOSIS — R1013 Epigastric pain: Secondary | ICD-10-CM | POA: Insufficient documentation

## 2016-02-25 DIAGNOSIS — Z79899 Other long term (current) drug therapy: Secondary | ICD-10-CM | POA: Insufficient documentation

## 2016-02-25 LAB — CBC
HCT: 33.4 % — ABNORMAL LOW (ref 36.0–46.0)
Hemoglobin: 11.6 g/dL — ABNORMAL LOW (ref 12.0–15.0)
MCH: 29.7 pg (ref 26.0–34.0)
MCHC: 34.7 g/dL (ref 30.0–36.0)
MCV: 85.4 fL (ref 78.0–100.0)
Platelets: 284 10*3/uL (ref 150–400)
RBC: 3.91 MIL/uL (ref 3.87–5.11)
RDW: 13 % (ref 11.5–15.5)
WBC: 7.2 10*3/uL (ref 4.0–10.5)

## 2016-02-25 LAB — BASIC METABOLIC PANEL
Anion gap: 6 (ref 5–15)
BUN: 11 mg/dL (ref 6–20)
CO2: 26 mmol/L (ref 22–32)
Calcium: 8.8 mg/dL — ABNORMAL LOW (ref 8.9–10.3)
Chloride: 106 mmol/L (ref 101–111)
Creatinine, Ser: 0.93 mg/dL (ref 0.44–1.00)
GFR calc Af Amer: >60 mL/min (ref >60)
GFR calc non Af Amer: >60 mL/min (ref >60)
Glucose, Bld: 87 mg/dL (ref 65–99)
Potassium: 3.5 mmol/L (ref 3.5–5.1)
Sodium: 138 mmol/L (ref 135–145)

## 2016-02-25 LAB — I-STAT TROPONIN, ED: Troponin i, poc: 0 ng/mL (ref 0.00–0.08)

## 2016-02-25 MED ORDER — SUCRALFATE 1 G PO TABS: 1.0000 g | ORAL_TABLET | Freq: Three times a day (TID) | ORAL | 0 refills | Status: DC

## 2016-02-25 MED ORDER — GI COCKTAIL ~~LOC~~
30.0000 mL | Freq: Once | ORAL | Status: AC
  Administered 2016-02-25: 30 mL via ORAL
  Filled 2016-02-25: qty 30

## 2016-02-25 MED ORDER — FAMOTIDINE IN NACL 20-0.9 MG/50ML-% IV SOLN
20.0000 mg | Freq: Once | INTRAVENOUS | Status: AC
  Administered 2016-02-25: 20 mg via INTRAVENOUS
  Filled 2016-02-25: qty 50

## 2016-02-25 NOTE — ED Triage Notes (Addendum)
Patient reports of chest pain that radiates to left shoulder blade x1 week. States she has had loss of appetite x1 week with frequent stools. 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 Was seen at health department but was unable to take phone call for results.

## 2016-02-25 NOTE — ED Notes (Signed)
Pt alert & oriented x4, stable gait. Patient given discharge instructions, paperwork & prescription(s). Patient  instructed to stop at the registration desk to finish any additional paperwork. Patient verbalized understanding. Pt left department w/ no further questions. 

## 2016-02-25 NOTE — ED Provider Notes (Signed)
[facility] DEPT Provider Note   CSN: [id number] Arrival date & time: 02/25/16  1642 ,By signing my name below, I, Dyke Brackett, attest that this documentation has been prepared under the direction and in the presence of Virgel Manifold, MD . Electronically Signed: Dyke Brackett, Scribe. 02/25/2016. 8:28 PM.   History   Chief Complaint Chief Complaint  Patient presents with  . Chest Pain    HPI Sara Pruitt is a 40 y.o. female with hx of IBS-C and GERD who presents to the Emergency Department complaining of sudden onset, constant epigastric pain which began one week ago while eating dinner. She describes her pain as sharp and piercing. Pain is unchanged by movement. Pt notes associated shoulder pain, back pain, loss of appetite, and nausea. Pt's shoulder pain began the next day. She states she is able to eat two spoonfuls of food before feeling full. Per pt, she has gained 10 pounds this week. She has taken simethicone and omeprazole with no relief. She takes ibuprofen one to two times a month. Pt denies any tobacco, drug or alcohol use. No abdominal SHx. Pt denies any dysuria or difficulty urinating.   The history is provided by the patient. No language interpreter was used.   Past Medical History:  Diagnosis Date  . Abnormal pap   . Chronic fatigue   . Constipation    ? IBS  . Fibroids    uterine  . GERD (gastroesophageal reflux disease)   . History of anemia   . Migraines   . S/P colonoscopy June 07, 2010   Dr. Gala Romney: secondary to constipation, normal. 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 Sickle cell trait [facility])     Patient Active Problem List   Diagnosis Date Noted  . S/P hysterectomy 11/24/2013  . Menometrorrhagia 10/01/2013  . Dysmenorrhea 10/01/2013  . Fibroid uterus 09/14/2012  . Cervical dysplasia , Tx'd LEEP 09/14/2012  . GERD 05/03/2010  . CONSTIPATION 05/03/2010    Past Surgical History:  Procedure Laterality Date  . BILATERAL SALPINGECTOMY  Bilateral 11/24/2013   Procedure: BILATERAL SALPINGECTOMY;  Surgeon: Florian Buff, MD;  Location: [facility];  Service: Gynecology;  Laterality: Bilateral;  . 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 SUPRACERVICAL ABDOMINAL HYSTERECTOMY N/A 11/24/2013   Procedure: HYSTERECTOMY SUPRACERVICAL ABDOMINAL;  Surgeon: Florian Buff, MD;  Location: [facility];  Service: Gynecology;  Laterality: N/A;  . TOE SURGERY Bilateral    removal of partial bone of bilateral small toes    OB History    Gravida Para Term Preterm AB Living   1 1           SAB TAB Ectopic Multiple Live Births                   Home Medications    Prior to Admission medications   Medication Sig Start Date End Date Taking? Authorizing Provider  Biotin 1000 MCG tablet Take 1,000 mcg by mouth daily.     Historical Provider, MD  dexlansoprazole (DEXILANT) 60 MG capsule Take 60 mg by mouth daily.    Historical Provider, MD  ketorolac (TORADOL) 10 MG tablet Take 1 tablet (10 mg total) by mouth every 8 (eight) hours as needed. Patient not taking: Reported on 07/29/2014 11/25/13   Florian Buff, MD  Lysine 500 MG TABS Take 2 tablets by mouth 3 (three) times daily.    Historical Provider, MD  medroxyPROGESTERone (PROVERA) 2.5 MG tablet Take 1 tablet (2.5 mg total) by  mouth daily. 07/29/14   Florian Buff, MD  metroNIDAZOLE (FLAGYL) 500 MG tablet Take 1 tablet (500 mg total) by mouth 2 (two) times daily. 07/29/14   Florian Buff, MD  mirabegron ER (MYRBETRIQ) 50 MG TB24 tablet Take 50 mg by mouth daily.    Historical Provider, MD  Multiple Vitamin (MULTIVITAMIN) tablet Take 1 tablet by mouth daily.    Historical Provider, MD  ondansetron (ZOFRAN) 8 MG tablet Take 1 tablet (8 mg total) by mouth every 6 (six) hours as needed for nausea. Patient not taking: Reported on 07/29/2014 11/25/13   Florian Buff, MD  oxyCODONE-acetaminophen (PERCOCET/ROXICET) 5-325 MG per tablet Take 1-2 tablets by mouth every 4 (four) hours as needed for severe pain (moderate to  severe pain (when tolerating fluids)). Patient not taking: Reported on 07/29/2014 11/25/13   Florian Buff, MD  Probiotic Product (PROBIOTIC DAILY) CAPS Take by mouth daily.    Historical Provider, MD  SUMAtriptan (IMITREX) 100 MG tablet Take 100 mg by mouth every 2 (two) hours as needed for migraine or headache. May repeat in 2 hours if headache persists or recurs.    Historical Provider, MD    Family History Family History  Problem Relation Age of Onset  . Alcohol abuse Father   . Hypertension Father   . Cancer Mother     colon  . Hypertension Mother   . Fibroids Mother   . Hypertension Maternal Grandmother   . Fibroids Maternal Grandmother   . Hypertension Paternal Grandmother   . Hypertension Brother   . Stroke Maternal Uncle     Social History Social History  Substance Use Topics  . Smoking status: Never Smoker  . Smokeless tobacco: Never Used  . Alcohol use No     Allergies   Strawberry extract and Sulfonamide derivatives   Review of Systems Review of Systems 10 systems reviewed and all are negative for acute change except as noted in the HPI.   Physical Exam Updated Vital Signs BP (!) 154/111 (BP Location: Right Arm)   Pulse 68   Temp 98.3 F (36.8 C) (Temporal)   Resp 16   Ht 5\' 7"  (1.702 m)   Wt 192 lb (87.1 kg)   SpO2 100%   BMI 30.07 kg/m   Physical Exam  Constitutional: She is oriented to person, place, and time. She appears well-developed and well-nourished. No distress.  HENT:  Head: Normocephalic and atraumatic.  Eyes: EOM are normal.  Neck: Normal range of motion.  Cardiovascular: Normal rate, regular rhythm and normal heart sounds.   Pulmonary/Chest: Effort normal and breath sounds normal.  Abdominal: Soft. She exhibits no distension. There is no tenderness. There is no rebound and no guarding.  Tender in the epigastrium  Musculoskeletal: Normal range of motion.  Neurological: She is alert and oriented to person, place, and time.  Skin:  Skin is warm and dry.  Psychiatric: She has a normal mood and affect. Judgment normal.  Nursing note and vitals reviewed.  ED Treatments / Results  DIAGNOSTIC STUDIES:  Oxygen Saturation is 100% on RA, normal by my interpretation.    COORDINATION OF CARE:  8:26 PM Discussed treatment plan with pt at bedside and pt agreed to plan.  Labs (all labs ordered are listed, but only abnormal results are displayed) Labs Reviewed  [place], ED    EKG  EKG Interpretation  Date/Time:  Sunday February 25 2016 16:53:09 EDT Ventricular Rate:  74 PR  Interval:  150 QRS Duration: 78 QT Interval:  388 QTC Calculation: 430 R Axis:   8 Text Interpretation:  Normal sinus rhythm Minimal voltage criteria for LVH, may be normal variant Borderline ECG No significant change since last tracing Confirmed by [email or web address]  MD, [place] [phone]) on 02/25/2016 5:07:08 PM       Radiology Dg Chest 2 View  Result Date: 02/25/2016 CLINICAL DATA:  Chest pain since Monday. Shortness of breath. Worsening pain. Pain left upper back. EXAM: CHEST  2 VIEW COMPARISON:  Chest radiograph 09/29/2013. FINDINGS: The heart size and mediastinal contours are within normal limits. Both lungs are clear. The visualized skeletal structures are unremarkable. IMPRESSION: No active cardiopulmonary disease. Electronically Signed   By: Curlene Dolphin M.D.   On: 02/25/2016 17:48    Procedures Procedures (including critical care time)  Medications Ordered in ED Medications - No data to display   Initial Impression / Assessment and Plan / ED Course  I have reviewed the triage vital signs and the nursing notes.  Pertinent labs & imaging results that were available during my care of the patient were reviewed by me and considered in my medical decision making (see chart for details).  Clinical Course    40 year old female chest pain. Doubt ACS, PE, dissection or other emergent process. Suspect that  this is nonemergent GI issue.  Final Clinical Impressions(s) / ED Diagnoses   Final diagnoses:  Dyspepsia    New Prescriptions New Prescriptions   No medications on file    I personally preformed the services scribed in my presence. The recorded information has been reviewed is accurate. Virgel Manifold, MD.    Virgel Manifold, MD 03/04/16 1154

## 2016-11-28 ENCOUNTER — Encounter: Payer: Self-pay | Admitting: Internal Medicine

## 2016-12-04 ENCOUNTER — Encounter: Payer: Self-pay | Admitting: *Deleted

## 2016-12-05 ENCOUNTER — Ambulatory Visit: Payer: Medicaid Other | Admitting: Nurse Practitioner

## 2016-12-24 ENCOUNTER — Ambulatory Visit: Payer: Medicaid Other | Admitting: Gastroenterology

## 2017-01-03 ENCOUNTER — Encounter: Payer: Self-pay | Admitting: Obstetrics & Gynecology

## 2017-01-03 ENCOUNTER — Other Ambulatory Visit (HOSPITAL_COMMUNITY)
Admission: RE | Admit: 2017-01-03 | Discharge: 2017-01-03 | Disposition: A | Discharge status: Home / Self Care | Payer: Self-pay | Source: Ambulatory Visit | Attending: Obstetrics & Gynecology | Admitting: Obstetrics & Gynecology

## 2017-01-03 ENCOUNTER — Ambulatory Visit (INDEPENDENT_AMBULATORY_CARE_PROVIDER_SITE_OTHER): Payer: Self-pay | Admitting: Obstetrics & Gynecology

## 2017-01-03 VITALS — BP 100/70 | HR 76 | Ht 67.0 in | Wt 198.4 lb

## 2017-01-03 DIAGNOSIS — Z124 Encounter for screening for malignant neoplasm of cervix: Secondary | ICD-10-CM

## 2017-01-03 DIAGNOSIS — B9689 Other specified bacterial agents as the cause of diseases classified elsewhere: Secondary | ICD-10-CM

## 2017-01-03 DIAGNOSIS — N760 Acute vaginitis: Secondary | ICD-10-CM

## 2017-01-03 MED ORDER — METRONIDAZOLE 0.75 % VA GEL: VAGINAL | 0 refills | Status: DC

## 2017-01-03 NOTE — Progress Notes (Signed)
Chief Complaint  Patient presents with  . Referral    vaginal bleeding/ post hysterectomy    Blood pressure 100/70, pulse 76, height 5\' 7"  (1.702 m), weight 198 lb 6.4 oz (90 kg).  41 y.o. G1P1 No LMP recorded. Patient has had a hysterectomy. The current method of family planning is status post hysterectomy.  Outpatient Encounter Medications as of 01/03/2017  Medication Sig  . [DISCONTINUED] metroNIDAZOLE (METROGEL VAGINAL) 0.75 % vaginal gel Nightly x 5 nights (Patient not taking: Reported on 02/28/2017)  . [DISCONTINUED] omeprazole (PRILOSEC) 20 MG capsule Take 20 mg by mouth daily as needed (for acid reflux or GERD).  . [DISCONTINUED] sucralfate (CARAFATE) 1 g tablet Take 1 tablet (1 g total) by mouth 4 (four) times daily -  with meals and at bedtime. (Patient not taking: Reported on 01/03/2017)   No facility-administered encounter medications on file as of 01/03/2017.     Subjective Patient is seen for really a minimal amount of vaginal bleeding Happen mostly after that of intercourse No pain Has been just recently but not previously She is status post a supracervical, hysterectomy in July 2015  Objective General WDWN female NAD Vulva:  normal appearing vulva with no masses, tenderness or lesions Vagina:  normal mucosa, no discharge, no blood Cervix:  no cervical motion tenderness and no lesions normal exam Uterus:  uterus absent Adnexa: ovaries:,     Pertinent ROS No burning with urination, frequency or urgency No nausea, vomiting or diarrhea Nor fever chills or other constitutional symptoms   Labs or studies      Impression Diagnoses this Encounter::   ICD-10-CM   1. BV (bacterial vaginosis) N76.0    B96.89   2. Pap smear for cervical cancer screening Z12.4 Cytology - PAP    Established relevant diagnosis(es):    Plan/Recommendations: Meds ordered this encounter  Medications  . DISCONTD: metroNIDAZOLE (METROGEL VAGINAL) 0.75 % vaginal gel   Sig: Nightly x 5 nights    Dispense:  70 g    Refill:  0    Labs or Scans Ordered: No orders of the defined types were placed in this encounter.   Management:: Patient is having mild bacterial vaginosis otherwise I think this is probably just a little bit of endocervical glandular development which caused some bleeding several and all see any lesions or anything else that would concern me at this point patient struck she can follow-up if this continues or 30 other problems develop  Follow up No Follow-up on file.      All questions were answered.  Past Medical History:  Diagnosis Date  . Abnormal pap   . Chronic fatigue   . Constipation    ? IBS  . Fibroids    uterine  . GERD (gastroesophageal reflux disease)   . History of anemia   . Migraines   . S/P colonoscopy June 07, 2010   Dr. Gala Romney: secondary to constipation, normal. 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 Sickle cell trait [facility])     Past Surgical History:  Procedure Laterality Date  . BILATERAL SALPINGECTOMY Bilateral 11/24/2013   Procedure: BILATERAL SALPINGECTOMY;  Surgeon: Florian Buff, MD;  Location: [facility];  Service: Gynecology;  Laterality: Bilateral;  . 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 SUPRACERVICAL ABDOMINAL HYSTERECTOMY N/A 11/24/2013   Procedure: HYSTERECTOMY SUPRACERVICAL ABDOMINAL;  Surgeon: Florian Buff, MD;  Location: [facility];  Service: Gynecology;  Laterality: N/A;  . TOE SURGERY Bilateral  removal of partial bone of bilateral small toes    OB History    Gravida Para Term Preterm AB Living   1 1           SAB TAB Ectopic Multiple Live Births                  Allergies  Allergen Reactions  . Strawberry Extract Itching    Tongue itching. Any strawberry  . Sulfonamide Derivatives     Social History   Socioeconomic History  . Marital status: Single    Spouse name: None  . Number of children: None  . Years of education: None  . Highest education level: None  Social Needs  .  Financial resource strain: None  . Food insecurity - worry: None  . Food insecurity - inability: None  . Transportation needs - medical: None  . Transportation needs - non-medical: None  Occupational History  . None  Tobacco Use  . Smoking status: Never Smoker  . Smokeless tobacco: Never Used  Substance and Sexual Activity  . Alcohol use: No  . Drug use: No  . Sexual activity: Yes    Partners: Male    Birth control/protection: Surgical  Other Topics Concern  . None  Social History Narrative  . None    Family History  Problem Relation Age of Onset  . Alcohol abuse Father   . Hypertension Father   . Cancer Mother        colon. was in her late 40s/early 50s.  . Hypertension Mother   . Fibroids Mother   . Hypertension Maternal Grandmother   . Fibroids Maternal Grandmother   . Hypertension Paternal Grandmother   . Hypertension Brother   . Stroke Maternal Uncle   . Celiac disease Neg Hx   . Inflammatory bowel disease Neg Hx

## 2017-01-08 LAB — CYTOLOGY - PAP
Diagnosis: NEGATIVE
HPV (WINDOPATH): DETECTED — AB

## 2017-01-30 ENCOUNTER — Ambulatory Visit: Payer: Self-pay | Admitting: Nurse Practitioner

## 2017-02-04 ENCOUNTER — Encounter: Payer: Medicaid Other | Admitting: Cardiothoracic Surgery

## 2017-02-28 ENCOUNTER — Encounter: Payer: Self-pay | Admitting: Gastroenterology

## 2017-02-28 ENCOUNTER — Ambulatory Visit (INDEPENDENT_AMBULATORY_CARE_PROVIDER_SITE_OTHER): Payer: Self-pay | Admitting: Gastroenterology

## 2017-02-28 VITALS — BP 136/90 | HR 65 | Temp 98.0°F | Ht 67.0 in | Wt 203.2 lb

## 2017-02-28 DIAGNOSIS — R197 Diarrhea, unspecified: Secondary | ICD-10-CM | POA: Insufficient documentation

## 2017-02-28 DIAGNOSIS — K625 Hemorrhage of anus and rectum: Secondary | ICD-10-CM | POA: Insufficient documentation

## 2017-02-28 DIAGNOSIS — Z800 Family history of malignant neoplasm of digestive organs: Secondary | ICD-10-CM | POA: Insufficient documentation

## 2017-02-28 DIAGNOSIS — R198 Other specified symptoms and signs involving the digestive system and abdomen: Secondary | ICD-10-CM | POA: Insufficient documentation

## 2017-02-28 NOTE — Assessment & Plan Note (Signed)
41 y/o female with several month h/o change in bowel going from significant constipation to diarrhea, associated with blood in the stool. Mother had colon cancer in her 23s. Patient's last TCS was in 2012. She is overdue for colonoscopy at this time and now with change in bowel habits/rectal bleeding we need to go ahead and update colonoscopy. Differential includes IBD, malignancy, IBS with benign anorectal bleeding.  I have discussed the risks, alternatives, benefits with regards to but not limited to the risk of reaction to medication, bleeding, infection, perforation and the patient is agreeable to proceed. Written consent to be obtained.  Patient has noted improvement of her bloating/discomfort with gluten reduction. I suspect she is sensitive to gluten and less likely as celiac disease but we will screen. Obtain additional routine labs and GI profile.

## 2017-02-28 NOTE — Patient Instructions (Signed)
1. Please have your lab work and stool test done. 2. Colonoscopy scheduled.  Please see separate instructions.

## 2017-02-28 NOTE — Progress Notes (Signed)
Primary Care Physician:  Redmond School, MD  Primary Gastroenterologist:  Garfield Cornea, MD   Chief Complaint  Patient presents with  . Rectal Bleeding    has not had any spotting x 1 month  . Diarrhea    when she eats anything with flour    HPI:  Sara Pruitt is a 41 y.o. female here at the request of Charolotte Capuchin, FNP with Redmond School, MD for further evaluation of rectal bleeding, family history of colon cancer.  Patient's last colonoscopy was in 2012, normal.  Her mother had colon cancer when she was in her late 10s, early 76s.  Her back in September patient had 3 episodes of passing fresh blood per rectum with no associated stool.  Small volume described.  Since then she has had some "spotting".  Her baseline typically is constipation but she states before the bleeding started she had a change in her bowel habits.  She is having 2-3 stools a day, [place] 4-6.  When she saw her PCP for the rectal bleeding, she was given Cipro and Flagyl for both her bowel concerns as well as bacterial vaginosis.  No noted improvement in her symptoms.  Patient was complaining of abdominal bloating discomfort.  She weaned herself off of gluten and noted a significant improvement of her abdominal bloating/discomfort and in fact her symptoms resolved.  She notes that she has continued consuming some gluten for testing purposes.  Every time she eats gluten she feels bad.  She has a history of heartburn but well controlled with dietary modifications.    No current outpatient prescriptions on file.   No current facility-administered medications for this visit.     Allergies as of 02/28/2017 - Review Complete 02/28/2017  Allergen Reaction Noted  . Strawberry extract Itching 11/18/2013  . Sulfonamide derivatives      Past Medical History:  Diagnosis Date  . Abnormal pap   . Chronic fatigue   . Constipation    ? IBS  . Fibroids    uterine  . GERD (gastroesophageal reflux disease)   .  History of anemia   . Migraines   . S/P colonoscopy June 07, 2010   Dr. Gala Romney: secondary to constipation, normal. 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 Sickle cell trait [facility])     Past Surgical History:  Procedure Laterality Date  . BILATERAL SALPINGECTOMY Bilateral 11/24/2013   Procedure: BILATERAL SALPINGECTOMY;  Surgeon: Florian Buff, MD;  Location: [facility];  Service: Gynecology;  Laterality: Bilateral;  . 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 SUPRACERVICAL ABDOMINAL HYSTERECTOMY N/A 11/24/2013   Procedure: HYSTERECTOMY SUPRACERVICAL ABDOMINAL;  Surgeon: Florian Buff, MD;  Location: [facility];  Service: Gynecology;  Laterality: N/A;  . TOE SURGERY Bilateral    removal of partial bone of bilateral small toes    Family History  Problem Relation Age of Onset  . Alcohol abuse Father   . Hypertension Father   . Cancer Mother        colon. was in her late 40s/early 21s.  . Hypertension Mother   . Fibroids Mother   . Hypertension Maternal Grandmother   . Fibroids Maternal Grandmother   . Hypertension Paternal Grandmother   . Hypertension Brother   . Stroke Maternal Uncle   . Celiac disease Neg Hx   . Inflammatory bowel disease Neg Hx     Social History   Social History  . Marital status: Single    Spouse name: N/A  .  Number of children: N/A  . Years of education: N/A   Occupational History  . Not on file.   Social History Main Topics  . Smoking status: Never Smoker  . Smokeless tobacco: Never Used  . Alcohol use No  . Drug use: No  . Sexual activity: Yes    Partners: Male    Birth control/ protection: Surgical   Other Topics Concern  . Not on file   Social History Narrative  . No narrative on file      ROS:  General: Negative for anorexia, weight loss, fever, chills, fatigue, weakness. Eyes: Negative for vision changes.  ENT: Negative for hoarseness, difficulty swallowing , nasal congestion. CV: Negative for chest pain, angina, palpitations, dyspnea on  exertion, peripheral edema.  Respiratory: Negative for dyspnea at rest, dyspnea on exertion, cough, sputum, wheezing.  GI: See history of present illness. GU:  Negative for dysuria, hematuria, urinary incontinence, urinary frequency, nocturnal urination.  MS: Negative for joint pain, low back pain.  Derm: Negative for rash or itching.  Neuro: Negative for weakness, abnormal sensation, seizure, frequent headaches, memory loss, confusion.  Psych: Negative for anxiety, depression, suicidal ideation, hallucinations.  Endo: Negative for unusual weight change.  Heme: Negative for bruising or bleeding. Allergy: Negative for rash or hives.    Physical Examination:  BP 136/90   Pulse 65   Temp 98 F (36.7 C) (Oral)   Ht 5\' 7"  (1.702 m)   Wt 203 lb 3.2 oz (92.2 kg)   BMI 31.83 kg/m    General: Well-nourished, well-developed in no acute distress.  Head: Normocephalic, atraumatic.   Eyes: Conjunctiva pink, no icterus. Mouth: Oropharyngeal mucosa moist and pink , no lesions erythema or exudate. Neck: Supple without thyromegaly, masses, or lymphadenopathy.  Lungs: Clear to auscultation bilaterally.  Heart: Regular rate and rhythm, no murmurs rubs or gallops.  Abdomen: Bowel sounds are normal, mild rectus diastases, nontender, nondistended, no hepatosplenomegaly or masses, no abdominal bruits or    hernia , no rebound or guarding.   Rectal: Performed Extremities: No lower extremity edema. No clubbing or deformities.  Neuro: Alert and oriented x 4 , grossly normal neurologically.  Skin: Warm and dry, no rash or jaundice.   Psych: Alert and cooperative, normal mood and affect.

## 2017-03-04 ENCOUNTER — Encounter: Payer: Self-pay | Admitting: *Deleted

## 2017-03-04 ENCOUNTER — Other Ambulatory Visit: Payer: Self-pay | Admitting: *Deleted

## 2017-03-04 ENCOUNTER — Other Ambulatory Visit: Payer: Self-pay | Admitting: Gastroenterology

## 2017-03-04 ENCOUNTER — Telehealth: Payer: Self-pay | Admitting: *Deleted

## 2017-03-04 DIAGNOSIS — K625 Hemorrhage of anus and rectum: Secondary | ICD-10-CM

## 2017-03-04 DIAGNOSIS — Z800 Family history of malignant neoplasm of digestive organs: Secondary | ICD-10-CM

## 2017-03-04 DIAGNOSIS — R198 Other specified symptoms and signs involving the digestive system and abdomen: Secondary | ICD-10-CM

## 2017-03-04 NOTE — Telephone Encounter (Signed)
Spoke with patient and she is scheduled for her TCS w/ Dr. Gala Romney on 04/10/17 at 9:30am. Aware arrive time is 8:30am. Patient states she does not have any insurance right now. She will be given a sample of moviprep. I have placed instructions with her sample. She is coming by the office to pick this up.

## 2017-03-04 NOTE — Progress Notes (Signed)
CC'D TO PCP °

## 2017-03-05 LAB — GI PROFILE, STOOL, PCR
ASTROVIRUS: NOT DETECTED
Adenovirus F 40/41: NOT DETECTED
C difficile toxin A/B: NOT DETECTED
CYCLOSPORA CAYETANENSIS: NOT DETECTED
Campylobacter: NOT DETECTED
Cryptosporidium: NOT DETECTED
ENTAMOEBA HISTOLYTICA: NOT DETECTED
Enteroaggregative E coli: NOT DETECTED
Enteropathogenic E coli: NOT DETECTED
Enterotoxigenic E coli: NOT DETECTED
Giardia lamblia: NOT DETECTED
Norovirus GI/GII: NOT DETECTED
PLESIOMONAS SHIGELLOIDES: NOT DETECTED
Rotavirus A: NOT DETECTED
SALMONELLA: NOT DETECTED
SAPOVIRUS: NOT DETECTED
SHIGELLA/ENTEROINVASIVE E COLI: NOT DETECTED
Shiga-toxin-producing E coli: NOT DETECTED
VIBRIO CHOLERAE: NOT DETECTED
VIBRIO: NOT DETECTED
Yersinia enterocolitica: NOT DETECTED

## 2017-03-05 LAB — COMPREHENSIVE METABOLIC PANEL
A/G RATIO: 1.6 (ref 1.2–2.2)
ALT: 17 IU/L (ref 0–32)
AST: 18 IU/L (ref 0–40)
Albumin: 4.2 g/dL (ref 3.5–5.5)
Alkaline Phosphatase: 71 IU/L (ref 39–117)
BILIRUBIN TOTAL: 0.3 mg/dL (ref 0.0–1.2)
BUN/Creatinine Ratio: 11 (ref 9–23)
BUN: 11 mg/dL (ref 6–20)
CHLORIDE: 104 mmol/L (ref 96–106)
CO2: 25 mmol/L (ref 20–29)
Calcium: 9 mg/dL (ref 8.7–10.2)
Creatinine, Ser: 0.99 mg/dL (ref 0.57–1.00)
GFR calc non Af Amer: 72 mL/min/{1.73_m2} (ref >59)
GFR, EST AFRICAN AMERICAN: 83 mL/min/{1.73_m2} (ref >59)
Globulin, Total: 2.7 g/dL (ref 1.5–4.5)
Glucose: 94 mg/dL (ref 65–99)
POTASSIUM: 4 mmol/L (ref 3.5–5.2)
Sodium: 140 mmol/L (ref 134–144)
TOTAL PROTEIN: 6.9 g/dL (ref 6.0–8.5)

## 2017-03-05 LAB — CBC WITH DIFFERENTIAL/PLATELET
BASOS: 0 %
Basophils Absolute: 0 10*3/uL (ref 0.0–0.2)
EOS (ABSOLUTE): 0.1 10*3/uL (ref 0.0–0.4)
Eos: 1 %
Hematocrit: 35.7 % (ref 34.0–46.6)
Hemoglobin: 12.2 g/dL (ref 11.1–15.9)
Immature Grans (Abs): 0 10*3/uL (ref 0.0–0.1)
Immature Granulocytes: 0 %
LYMPHS ABS: 2.4 10*3/uL (ref 0.7–3.1)
Lymphs: 34 %
MCH: 29 pg (ref 26.6–33.0)
MCHC: 34.2 g/dL (ref 31.5–35.7)
MCV: 85 fL (ref 79–97)
MONOS ABS: 0.5 10*3/uL (ref 0.1–0.9)
Monocytes: 7 %
NEUTROS ABS: 4 10*3/uL (ref 1.4–7.0)
Neutrophils: 58 %
PLATELETS: 301 10*3/uL (ref 150–379)
RBC: 4.2 x10E6/uL (ref 3.77–5.28)
RDW: 14.4 % (ref 12.3–15.4)
WBC: 7 10*3/uL (ref 3.4–10.8)

## 2017-03-05 LAB — IGA: IgA/Immunoglobulin A, Serum: 241 mg/dL (ref 87–352)

## 2017-03-05 LAB — TISSUE TRANSGLUTAMINASE, IGA: Transglutaminase IgA: <2 U/mL (ref 0–3)

## 2017-03-05 LAB — TSH: TSH: 2.36 u[IU]/mL (ref 0.450–4.500)

## 2017-03-06 NOTE — Progress Notes (Signed)
Please let patient know her CBC, kidney/liver labs, thyroid labs and celiac screen were all unremarkable.  Stool test is negative.   Colonoscopy as scheduled.

## 2017-04-10 ENCOUNTER — Encounter (HOSPITAL_COMMUNITY): Payer: Self-pay | Admitting: *Deleted

## 2017-04-10 ENCOUNTER — Ambulatory Visit (HOSPITAL_COMMUNITY)
Admission: RE | Admit: 2017-04-10 | Discharge: 2017-04-10 | Disposition: A | Discharge status: Home / Self Care | Payer: Medicaid Other | Source: Ambulatory Visit | Attending: Internal Medicine | Admitting: Internal Medicine

## 2017-04-10 ENCOUNTER — Encounter (HOSPITAL_COMMUNITY)
Admission: RE | Disposition: A | Discharge status: Home / Self Care | Payer: Self-pay | Source: Ambulatory Visit | Attending: Internal Medicine

## 2017-04-10 ENCOUNTER — Other Ambulatory Visit: Payer: Self-pay

## 2017-04-10 DIAGNOSIS — Z79899 Other long term (current) drug therapy: Secondary | ICD-10-CM | POA: Insufficient documentation

## 2017-04-10 DIAGNOSIS — Z91018 Allergy to other foods: Secondary | ICD-10-CM | POA: Insufficient documentation

## 2017-04-10 DIAGNOSIS — Z8541 Personal history of malignant neoplasm of cervix uteri: Secondary | ICD-10-CM | POA: Insufficient documentation

## 2017-04-10 DIAGNOSIS — K625 Hemorrhage of anus and rectum: Secondary | ICD-10-CM

## 2017-04-10 DIAGNOSIS — R198 Other specified symptoms and signs involving the digestive system and abdomen: Secondary | ICD-10-CM

## 2017-04-10 DIAGNOSIS — K5909 Other constipation: Secondary | ICD-10-CM | POA: Insufficient documentation

## 2017-04-10 DIAGNOSIS — R5382 Chronic fatigue, unspecified: Secondary | ICD-10-CM | POA: Insufficient documentation

## 2017-04-10 DIAGNOSIS — Z882 Allergy status to sulfonamides status: Secondary | ICD-10-CM | POA: Insufficient documentation

## 2017-04-10 DIAGNOSIS — K921 Melena: Secondary | ICD-10-CM

## 2017-04-10 DIAGNOSIS — Z800 Family history of malignant neoplasm of digestive organs: Secondary | ICD-10-CM | POA: Insufficient documentation

## 2017-04-10 DIAGNOSIS — K219 Gastro-esophageal reflux disease without esophagitis: Secondary | ICD-10-CM | POA: Insufficient documentation

## 2017-04-10 HISTORY — PX: COLONOSCOPY: SHX5424

## 2017-04-10 SURGERY — COLONOSCOPY
Anesthesia: Moderate Sedation

## 2017-04-10 MED ORDER — STERILE WATER FOR IRRIGATION IR SOLN
Status: DC | PRN
  Administered 2017-04-10: 09:00:00

## 2017-04-10 MED ORDER — ONDANSETRON HCL 4 MG/2ML IJ SOLN
INTRAMUSCULAR | Status: DC | PRN
  Administered 2017-04-10: 4 mg via INTRAVENOUS

## 2017-04-10 MED ORDER — MIDAZOLAM HCL 5 MG/5ML IJ SOLN
INTRAMUSCULAR | Status: AC
  Filled 2017-04-10: qty 10

## 2017-04-10 MED ORDER — MIDAZOLAM HCL 5 MG/5ML IJ SOLN
INTRAMUSCULAR | Status: DC | PRN
  Administered 2017-04-10: 1 mg via INTRAVENOUS
  Administered 2017-04-10: 2 mg via INTRAVENOUS

## 2017-04-10 MED ORDER — SODIUM CHLORIDE 0.9 % IV SOLN
INTRAVENOUS | Status: DC
  Administered 2017-04-10: 09:00:00 via INTRAVENOUS

## 2017-04-10 MED ORDER — MEPERIDINE HCL 100 MG/ML IJ SOLN
INTRAMUSCULAR | Status: AC
  Filled 2017-04-10: qty 2

## 2017-04-10 MED ORDER — ONDANSETRON HCL 4 MG/2ML IJ SOLN
INTRAMUSCULAR | Status: AC
  Filled 2017-04-10: qty 2

## 2017-04-10 MED ORDER — MEPERIDINE HCL 100 MG/ML IJ SOLN
INTRAMUSCULAR | Status: DC | PRN
  Administered 2017-04-10: 50 mg via INTRAVENOUS

## 2017-04-10 NOTE — Op Note (Signed)
[facility] Patient Name: Sara Pruitt Procedure Date: 04/10/2017 8:43 AM MRN: [id number] Date of Birth: Aug 09, 1975 Attending MD: Norvel Richards , MD CSN: [id number] Age: 41 Admit Type: Outpatient Procedure:                Colonoscopy Indications:              Hematochezia Providers:                Norvel Richards, MD, Janeece Riggers, RN, Hinton Rao, RN Referring MD:              Medicines:                Midazolam 3 mg IV, Meperidine 50 mg IV, Ondansetron                            4 mg IV Complications:            No immediate complications. I suspect benign ano-                            rectal bleeding in the setting of constipation. Estimated Blood Loss:     Estimated blood loss: none. Estimated blood loss:                            none. Procedure:                Pre-Anesthesia Assessment:                           - Prior to the procedure, a History and Physical                            was performed, and patient medications and                            allergies were reviewed. The patient's tolerance of                            previous anesthesia was also reviewed. The risks                            and benefits of the procedure and the sedation                            options and risks were discussed with the patient.                            All questions were answered, and informed consent                            was obtained. Prior Anticoagulants: The patient has  taken no previous anticoagulant or antiplatelet                            agents. ASA Grade Assessment: II - A patient with                            mild systemic disease. After reviewing the risks                            and benefits, the patient was deemed in                            satisfactory condition to undergo the procedure.                           After obtaining informed consent, the colonoscope                            was passed under direct vision. Throughout the                            procedure, the patient's blood pressure, pulse, and                            oxygen saturations were monitored continuously. The                            EC-[id number] ([id number]) scope was introduced through                            the anus and advanced to the the cecum, identified                            by appendiceal orifice and ileocecal valve. The                            colonoscopy was performed without difficulty. The                            patient tolerated the procedure well. The quality                            of the bowel preparation was adequate. The                            ileocecal valve, appendiceal orifice, and rectum                            were photographed. The quality of the bowel                            preparation was adequate. Scope In: 9:23:48 AM Scope Out: 9:35:09 AM Scope Withdrawal Time: 0 hours 8 minutes 8 seconds  Total Procedure Duration: 0 hours  11 minutes 21 seconds  Findings:      The perianal and digital rectal examinations were normal.      The colon (entire examined portion) appeared normal. Estimated blood       loss: none.      The retroflexed view of the distal rectum and anal verge was normal and       showed no anal or rectal abnormalities. Impression:               - The entire examined colon is normal.                           - The distal rectum and anal verge are normal on                            retroflexion view.                           - No specimens collected. Begin Benefiber 1                            tablespoon twice daily. Office visit with [place] in 6                            week. Course of Anusol cream - apply to the                            anorectum twice a day Moderate Sedation:      Moderate (conscious) sedation was administered by the endoscopy nurse       and supervised by the endoscopist. The  following parameters were       monitored: oxygen saturation, heart rate, blood pressure, respiratory       rate, EKG, adequacy of pulmonary ventilation, and response to care.       Total physician intraservice time was 15 minutes. Recommendation:           10 days- Patient has a contact number available for                            emergencies. The signs and symptoms of potential                            delayed complications were discussed with the                            patient. Return to normal activities tomorrow.                            Written discharge instructions were provided to the                            patient.                           - Repeat colonoscopy PRN for surveillance of  high-grade dysplasia.                           - Repeat colonoscopy in 5 years for surveillance. Procedure Code(s):        --- Professional ---                           [id number], Colonoscopy, flexible; diagnostic, including                            collection of specimen(s) by brushing or washing,                            when performed (separate procedure)                           99152, Moderate sedation services provided by the                            same physician or other qualified health care                            professional performing the diagnostic or                            therapeutic service that the sedation supports,                            requiring the presence of an independent trained                            observer to assist in the monitoring of the                            patient's level of consciousness and physiological                            status; initial 15 minutes of intraservice time,                            patient age 26 years or older Diagnosis Code(s):        --- Professional ---                           K92.1, Melena (includes Hematochezia) CPT copyright 2016 American Medical Association.  All rights reserved. The codes documented in this report are preliminary and upon coder review may  be revised to meet current compliance requirements. [name]. [name], MD Norvel Richards, MD 04/10/2017 9:48:17 AM This report has been signed electronically. Number of Addenda: 0

## 2017-04-10 NOTE — H&P (Signed)
@LOGO @   Primary Care Physician:  Redmond School, MD Primary Gastroenterologist:  Dr. Gala Romney  Pre-Procedure History & Physical: HPI:  Sara Pruitt is a 41 y.o. female here for colonoscopy for rectal bleeding. Chronic constipation. Positive family history of colon cancer.  Past Medical History:  Diagnosis Date  . Abnormal pap   . Chronic fatigue   . Constipation    ? IBS  . Fibroids    uterine  . GERD (gastroesophageal reflux disease)   . History of anemia   . Migraines   . S/P colonoscopy June 07, 2010   Dr. Gala Romney: secondary to constipation, normal. 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 Sickle cell trait [facility])     Past Surgical History:  Procedure Laterality Date  . BILATERAL SALPINGECTOMY Bilateral 11/24/2013   Procedure: BILATERAL SALPINGECTOMY;  Surgeon: Florian Buff, MD;  Location: [facility];  Service: Gynecology;  Laterality: Bilateral;  . 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 SUPRACERVICAL ABDOMINAL HYSTERECTOMY N/A 11/24/2013   Procedure: HYSTERECTOMY SUPRACERVICAL ABDOMINAL;  Surgeon: Florian Buff, MD;  Location: [facility];  Service: Gynecology;  Laterality: N/A;  . TOE SURGERY Bilateral    removal of partial bone of bilateral small toes    Prior to Admission medications   Medication Sig Start Date End Date Taking? Authorizing Provider  aspirin-acetaminophen-caffeine (EXCEDRIN MIGRAINE) [phone] MG tablet Take 2 tablets by mouth every 4 (four) hours as needed for headache.   Yes [provider]  bismuth subsalicylate (PEPTO BISMOL) 262 MG/15ML suspension Take 30 mLs by mouth daily as needed for indigestion or diarrhea or loose stools.   Yes [provider]  Multiple Vitamin (MULTIVITAMIN WITH MINERALS) TABS tablet Take 1 tablet by mouth daily.   Yes [provider]    Allergies as of 03/04/2017 - Review Complete 02/28/2017  Allergen Reaction Noted  . Strawberry extract Itching 11/18/2013  . Sulfonamide derivatives      Family  History  Problem Relation Age of Onset  . Alcohol abuse Father   . Hypertension Father   . Cancer Mother        colon. was in her late 40s/early 67s.  . Hypertension Mother   . Fibroids Mother   . Hypertension Maternal Grandmother   . Fibroids Maternal Grandmother   . Hypertension Paternal Grandmother   . Hypertension Brother   . Stroke Maternal Uncle   . Celiac disease Neg Hx   . Inflammatory bowel disease Neg Hx     Social History   Socioeconomic History  . Marital status: Single    Spouse name: Not on file  . Number of children: Not on file  . Years of education: Not on file  . Highest education level: Not on file  Social Needs  . Financial resource strain: Not on file  . Food insecurity - worry: Not on file  . Food insecurity - inability: Not on file  . Transportation needs - medical: Not on file  . Transportation needs - non-medical: Not on file  Occupational History  . Not on file  Tobacco Use  . Smoking status: Never Smoker  . Smokeless tobacco: Never Used  Substance and Sexual Activity  . Alcohol use: No  . Drug use: No  . Sexual activity: Yes    Partners: Male    Birth control/protection: Surgical  Other Topics Concern  . Not on file  Social History Narrative  . Not on file    Review of Systems: See HPI, otherwise  negative ROS  Physical Exam: BP (!) 142/101   Pulse 77   Temp 99 F (37.2 C) (Oral)   Resp 15   Ht 5\' 7"  (1.702 m)   Wt 205 lb (93 kg)   SpO2 100%   BMI 32.11 kg/m  General:   Alert,  Well-developed, well-nourished, pleasant and cooperative in NAD Neck:  Supple; no masses or thyromegaly. No significant cervical adenopathy. Lungs:  Clear throughout to auscultation.   No wheezes, crackles, or rhonchi. No acute distress. Heart:  Regular rate and rhythm; no murmurs, clicks, rubs,  or gallops. Abdomen: Non-distended, normal bowel sounds.  Soft and nontender without appreciable mass or hepatosplenomegaly.  Pulses:  Normal pulses  noted. Extremities:  Without clubbing or edema.  Impression:   Pleasant 41 year old lady with intermittent rectal bleeding and setting constipation positive family history colon cancer.  Recommendations:  I have offered this nice lady a diagnostic colonoscopy today. The risks, benefits, limitations, alternatives and imponderables have been reviewed with the patient. Questions have been answered. All parties are agreeable.      Notice: This dictation was prepared with Dragon dictation along with smaller phrase technology. Any transcriptional errors that result from this process are unintentional and may not be corrected upon review.

## 2017-04-10 NOTE — Discharge Instructions (Signed)
Colonoscopy Discharge Instructions  Read the instructions outlined below and refer to this sheet in the next few weeks. These discharge instructions provide you with general information on caring for yourself after you leave the hospital. Your doctor may also give you specific instructions. While your treatment has been planned according to the most current medical practices available, unavoidable complications occasionally occur. If you have any problems or questions after discharge, call Dr. Gala Pruitt at [phone]. ACTIVITY  You may resume your regular activity, but move at a slower pace for the next 24 hours.   Take frequent rest periods for the next 24 hours.   Walking will help get rid of the air and reduce the bloated feeling in your belly (abdomen).   No driving for 24 hours (because of the medicine (anesthesia) used during the test).    Do not sign any important legal documents or operate any machinery for 24 hours (because of the anesthesia used during the test).  NUTRITION  Drink plenty of fluids.   You may resume your normal diet as instructed by your doctor.   Begin with a light meal and progress to your normal diet. Heavy or fried foods are harder to digest and may make you feel sick to your stomach (nauseated).   Avoid alcoholic beverages for 24 hours or as instructed.  MEDICATIONS  You may resume your normal medications unless your doctor tells you otherwise.  WHAT YOU CAN EXPECT TODAY  Some feelings of bloating in the abdomen.   Passage of more gas than usual.   Spotting of blood in your stool or on the toilet paper.  IF YOU HAD POLYPS REMOVED DURING THE COLONOSCOPY:  No aspirin products for 7 days or as instructed.   No alcohol for 7 days or as instructed.   Eat a soft diet for the next 24 hours.  FINDING OUT THE RESULTS OF YOUR TEST Not all test results are available during your visit. If your test results are not back during the visit, make an appointment  with your caregiver to find out the results. Do not assume everything is normal if you have not heard from your caregiver or the medical facility. It is important for you to follow up on all of your test results.  SEEK IMMEDIATE MEDICAL ATTENTION IF:  You have more than a spotting of blood in your stool.   Your belly is swollen (abdominal distention).   You are nauseated or vomiting.   You have a temperature over 101.   You have abdominal pain or discomfort that is severe or gets worse throughout the day.    Repeat colonoscopy in 5 years  Begin Benefiber 1 tablespoon daily for one month;  then increase to 1 tablespoon twice daily thereafter  Anusol HC cream-apply a pea-sized amount to the anorectum twice daily  Office visit with [place] in about 7 weeks.  Your appointment is Friday, February 8 @ 9;30 AM  Information on constipation provided.    Constipation, Adult Constipation is when a person:  Poops (has a bowel movement) fewer times in a week than normal.  Has a hard time pooping.  Has poop that is dry, hard, or bigger than normal.  Follow these instructions at home: Eating and drinking   Eat foods that have a lot of fiber, such as: ? Fresh fruits and vegetables. ? Whole grains. ? Beans.  Eat less of foods that are high in fat, low in fiber, or overly processed, such as: ? [place]  fries. ? Hamburgers. ? Cookies. ? Candy. ? Soda.  Drink enough fluid to keep your pee (urine) clear or pale yellow. General instructions  Exercise regularly or as told by your doctor.  Go to the restroom when you feel like you need to poop. Do not hold it in.  Take over-the-counter and prescription medicines only as told by your doctor. These include any fiber supplements.  Do pelvic floor retraining exercises, such as: ? Doing deep breathing while relaxing your lower belly (abdomen). ? Relaxing your pelvic floor while pooping.  Watch your condition for any changes.  Keep all  follow-up visits as told by your doctor. This is important. Contact a doctor if:  You have pain that gets worse.  You have a fever.  You have not pooped for 4 days.  You throw up (vomit).  You are not hungry.  You lose weight.  You are bleeding from the anus.  You have thin, pencil-like poop (stool). Get help right away if:  You have a fever, and your symptoms suddenly get worse.  You leak poop or have blood in your poop.  Your belly feels hard or bigger than normal (is bloated).  You have very bad belly pain.  You feel dizzy or you faint. This information is not intended to replace advice given to you by your health care provider. Make sure you discuss any questions you have with your health care provider.    Colonoscopy, Adult, Care After This sheet gives you information about how to care for yourself after your procedure. Your doctor may also give you more specific instructions. If you have problems or questions, call your doctor. Follow these instructions at home: General instructions   For the first 24 hours after the procedure: ? Do not drive or use machinery. ? Do not sign important documents. ? Do not drink alcohol. ? Do your daily activities more slowly than normal. ? Eat foods that are soft and easy to digest. ? Rest often.  Take over-the-counter or prescription medicines only as told by your doctor.  It is up to you to get the results of your procedure. Ask your doctor, or the department performing the procedure, when your results will be ready. To help cramping and bloating:  Try walking around.  Put heat on your belly (abdomen) as told by your doctor. Use a heat source that your doctor recommends, such as a moist heat pack or a heating pad. ? Put a towel between your skin and the heat source. ? Leave the heat on for 20-30 minutes. ? Remove the heat if your skin turns bright red. This is especially important if you cannot feel pain, heat, or cold.  You can get burned. Eating and drinking  Drink enough fluid to keep your pee (urine) clear or pale yellow.  Return to your normal diet as told by your doctor. Avoid heavy or fried foods that are hard to digest.  Avoid drinking alcohol for as long as told by your doctor. Contact a doctor if:  You have blood in your poop (stool) 2-3 days after the procedure. Get help right away if:  You have more than a small amount of blood in your poop.  You see large clumps of tissue (blood clots) in your poop.  Your belly is swollen.  You feel sick to your stomach (nauseous).  You throw up (vomit).  You have a fever.  You have belly pain that gets worse, and medicine does not help your  pain. This information is not intended to replace advice given to you by your health care provider. Make sure you discuss any questions you have with your health care provider.

## 2017-04-14 ENCOUNTER — Encounter (HOSPITAL_COMMUNITY): Payer: Self-pay | Admitting: Internal Medicine

## 2017-06-06 ENCOUNTER — Telehealth: Payer: Self-pay | Admitting: *Deleted

## 2017-06-06 ENCOUNTER — Encounter: Payer: Self-pay | Admitting: Gastroenterology

## 2017-06-06 ENCOUNTER — Ambulatory Visit (INDEPENDENT_AMBULATORY_CARE_PROVIDER_SITE_OTHER): Payer: Self-pay | Admitting: Gastroenterology

## 2017-06-06 VITALS — BP 137/92 | HR 62 | Temp 97.2°F | Ht 67.0 in | Wt 210.2 lb

## 2017-06-06 DIAGNOSIS — K5900 Constipation, unspecified: Secondary | ICD-10-CM

## 2017-06-06 DIAGNOSIS — R110 Nausea: Secondary | ICD-10-CM

## 2017-06-06 DIAGNOSIS — G8929 Other chronic pain: Secondary | ICD-10-CM

## 2017-06-06 DIAGNOSIS — R197 Diarrhea, unspecified: Secondary | ICD-10-CM

## 2017-06-06 DIAGNOSIS — R1013 Epigastric pain: Secondary | ICD-10-CM

## 2017-06-06 DIAGNOSIS — R112 Nausea with vomiting, unspecified: Secondary | ICD-10-CM

## 2017-06-06 DIAGNOSIS — R1030 Lower abdominal pain, unspecified: Secondary | ICD-10-CM

## 2017-06-06 NOTE — Telephone Encounter (Signed)
CT ABD/PELVIS W/ CONTRAST scheduled for 06/19/17 at 9:00am, arrival time 8:45am, NPO 4 hrs prior, p/u oral contrast prior to test. Checked [employer] and no PA is required for member for outpatient radiology services at this time.   Patient is aware of appt details. She verbalized understanding and needed nothing further.

## 2017-06-06 NOTE — Progress Notes (Signed)
Primary Care Physician: Redmond School, MD  Primary Gastroenterologist:  Garfield Cornea, MD   Chief Complaint  Patient presents with  . Rectal Bleeding    has stopped  . Abdominal Cramping    before she has a BM, w/ nausea    HPI: Sara Pruitt is a 42 y.o. female here for follow-up.  She was seen back in November 2018 for rectal bleeding, family history of colon cancer.  Mother had colon cancer in her late 24s, early 4s.  She is also been complaining of abdominal bloating, change in bowel habit with alternating constipation and diarrhea.  Labs done in November revealed normal hemoglobin, renal function and liver function tests normal.  TSH and celiac screen negative.  GI profile was unremarkable.  Colonoscopy unremarkable back in December.  Complains of daily severe cramping in lower abdomen and radiating into upper abdomen prior to each BM. Lasts for several minutes after the BM. Associated with nausea. Stools range from bristol 1 to 7 all in the same day at times. Wakes up in middle of night at least twice weekly with these symptoms. No heartburn. Taking metamucil couple of times per week only. Pepto rarely used, last time in 3 weeks.  Aspirin powders for migraine headaches, rarely uses.  Takes multivitamin daily but no other medications on a rare this.  No weight loss.  No melena.  No further rectal bleeding.   Current Outpatient Medications  Medication Sig Dispense Refill  . aspirin-acetaminophen-caffeine (EXCEDRIN MIGRAINE) 250-250-65 MG tablet Take 2 tablets by mouth every 4 (four) hours as needed for headache.    . bismuth subsalicylate (PEPTO BISMOL) 262 MG/15ML suspension Take 30 mLs by mouth daily as needed for indigestion or diarrhea or loose stools.    . Multiple Vitamin (MULTIVITAMIN WITH MINERALS) TABS tablet Take 1 tablet by mouth daily.    . psyllium (METAMUCIL) 58.6 % packet Take 1 packet by mouth as needed.     No current facility-administered medications  for this visit.     Allergies as of 06/06/2017 - Review Complete 06/06/2017  Allergen Reaction Noted  . Strawberry extract Itching 11/18/2013  . Sulfonamide derivatives Hives and Rash     ROS:  General: Negative for anorexia, weight loss, fever, chills, fatigue, weakness. ENT: Negative for hoarseness, difficulty swallowing , nasal congestion. CV: Negative for chest pain, angina, palpitations, dyspnea on exertion, peripheral edema.  Respiratory: Negative for dyspnea at rest, dyspnea on exertion, cough, sputum, wheezing.  GI: See history of present illness. GU:  Negative for dysuria, hematuria, urinary incontinence, urinary frequency, nocturnal urination.  Endo: Negative for unusual weight change.    Physical Examination:   BP (!) 137/92   Pulse 62   Temp (!) 97.2 F (36.2 C) (Oral)   Ht 5\' 7"  (1.702 m)   Wt 210 lb 3.2 oz (95.3 kg)   BMI 32.92 kg/m   General: Well-nourished, well-developed in no acute distress.  Eyes: No icterus. Mouth: Oropharyngeal mucosa moist and pink , no lesions erythema or exudate. Lungs: Clear to auscultation bilaterally.  Heart: Regular rate and rhythm, no murmurs rubs or gallops.  Abdomen: Bowel sounds are normal, moderate epigastric tenderness, lower abdominal tenderness.  Rectus diastases.  nondistended, no hepatosplenomegaly or masses, no abdominal bruits or hernia , no rebound or guarding.   Extremities: No lower extremity edema. No clubbing or deformities. Neuro: Alert and oriented x 4   Skin: Warm and dry, no jaundice.   Psych: Alert and cooperative, normal  mood and affect.   Imaging Studies: No results found.

## 2017-06-06 NOTE — Assessment & Plan Note (Signed)
42 year old female with 3-year history of abdominal pain with bowel issues.  Family history of colon cancer, patient had recent colonoscopy which was unremarkable.  Labs back in November unremarkable.  No evidence of celiac disease.  No evidence of IBD.  She complains now of severe abdominal cramping prior to bowel movement beginning in the lower abdomen, radiating up into the epigastrium and associated with severe nausea happens twice daily.  Sometimes wakes her up from her sleep.  She has rectus diastases on exam but no obvious hernia.  She has never had abdominal in imaging.  Plan for CT with abdomen pelvis with contrast to rule out evidence of obstruction, hernia, etc.  Advised her to take Metamucil on a daily basis to try to even out her bowel function.  If her CT is unremarkable we will plan on antispasmodic as a next step.

## 2017-06-06 NOTE — Patient Instructions (Signed)
1. CT scan as scheduled. We will contact you with results as available. 2. Please take your fiber every day.

## 2017-06-09 NOTE — Progress Notes (Signed)
CC'D TO PCP °

## 2017-06-13 ENCOUNTER — Ambulatory Visit: Payer: Self-pay | Admitting: Gastroenterology

## 2017-06-18 ENCOUNTER — Ambulatory Visit (HOSPITAL_COMMUNITY): Payer: Medicaid Other

## 2017-06-19 ENCOUNTER — Ambulatory Visit (HOSPITAL_COMMUNITY)
Admission: RE | Admit: 2017-06-19 | Discharge: 2017-06-19 | Disposition: A | Discharge status: Home / Self Care | Payer: Self-pay | Source: Ambulatory Visit | Attending: Gastroenterology | Admitting: Gastroenterology

## 2017-06-19 DIAGNOSIS — R1013 Epigastric pain: Secondary | ICD-10-CM | POA: Insufficient documentation

## 2017-06-19 DIAGNOSIS — R1030 Lower abdominal pain, unspecified: Secondary | ICD-10-CM | POA: Insufficient documentation

## 2017-06-19 DIAGNOSIS — R112 Nausea with vomiting, unspecified: Secondary | ICD-10-CM | POA: Insufficient documentation

## 2017-06-19 MED ORDER — IOPAMIDOL (ISOVUE-300) INJECTION 61%
100.0000 mL | Freq: Once | INTRAVENOUS | Status: AC | PRN
  Administered 2017-06-19: 100 mL via INTRAVENOUS

## 2017-06-22 ENCOUNTER — Other Ambulatory Visit: Payer: Self-pay

## 2017-06-22 ENCOUNTER — Emergency Department (HOSPITAL_COMMUNITY)
Admission: EM | Admit: 2017-06-22 | Discharge: 2017-06-22 | Disposition: A | Discharge status: Home / Self Care | Payer: Medicaid Other | Attending: Emergency Medicine | Admitting: Emergency Medicine

## 2017-06-22 ENCOUNTER — Encounter (HOSPITAL_COMMUNITY): Payer: Self-pay | Admitting: Emergency Medicine

## 2017-06-22 DIAGNOSIS — Z79899 Other long term (current) drug therapy: Secondary | ICD-10-CM | POA: Insufficient documentation

## 2017-06-22 DIAGNOSIS — H1131 Conjunctival hemorrhage, right eye: Secondary | ICD-10-CM

## 2017-06-22 DIAGNOSIS — H1132 Conjunctival hemorrhage, left eye: Secondary | ICD-10-CM

## 2017-06-22 NOTE — Discharge Instructions (Signed)
Try to avoid eye strain you may use over-the-counter lubricating drops to your eye 1 drop every 1-2 hours as needed.  Symptoms should improve in 2 weeks.  You can call the ophthalmologist listed to arrange a follow-up appointment if needed.  Return to the ER for any worsening symptoms.  You also need to have your primary doctor recheck your blood pressure.

## 2017-06-22 NOTE — ED Provider Notes (Signed)
[facility] EMERGENCY DEPARTMENT Provider Note   CSN: [id number] Arrival date & time: 06/22/17  1351     History   Chief Complaint Chief Complaint  Patient presents with  . Eye Problem    HPI Sara Pruitt is a 42 y.o. female.  HPI   Sara Pruitt is a 42 y.o. female who presents to the Emergency Department complaining of redness to both eyes that began yesterday.  She states she woke up and noticed that she had a "ruptured blood vessel in both her eyes with more noticeable symptoms in the left.  She also describes a "irritation" to her left eye associated with blinking.  She denies known trauma, but states that she was vomiting 2 days ago.  She denies headache, dizziness, neck pain, persistent vomiting.  Wears corrective lenses for reading only.   Past Medical History:  Diagnosis Date  . Abnormal pap   . Chronic fatigue   . Constipation    ? IBS  . Fibroids    uterine  . GERD (gastroesophageal reflux disease)   . History of anemia   . Migraines   . S/P colonoscopy June 07, 2010   Dr. Gala Romney: secondary to constipation, normal. 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 Sickle cell trait [facility])     Patient Active Problem List   Diagnosis Date Noted  . Abdominal pain, chronic, epigastric 06/06/2017  . Lower abdominal pain 06/06/2017  . Nausea without vomiting 06/06/2017  . Change in bowel function 02/28/2017  . Rectal bleeding 02/28/2017  . Family hx of colon cancer 02/28/2017  . Diarrhea 02/28/2017  . S/P hysterectomy 11/24/2013  . Menometrorrhagia 10/01/2013  . Dysmenorrhea 10/01/2013  . Fibroid uterus 09/14/2012  . Cervical dysplasia , Tx'd LEEP 09/14/2012  . GERD 05/03/2010  . Constipation 05/03/2010    Past Surgical History:  Procedure Laterality Date  . BILATERAL SALPINGECTOMY Bilateral 11/24/2013   Procedure: BILATERAL SALPINGECTOMY;  Surgeon: Florian Buff, MD;  Location: [facility];  Service: Gynecology;  Laterality: Bilateral;  . COLONOSCOPY N/A  04/10/2017   Normal. next TCS in 5 years due to [place] of colon cancer  . 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 SUPRACERVICAL ABDOMINAL HYSTERECTOMY N/A 11/24/2013   Procedure: HYSTERECTOMY SUPRACERVICAL ABDOMINAL;  Surgeon: Florian Buff, MD;  Location: [facility];  Service: Gynecology;  Laterality: N/A;  . TOE SURGERY Bilateral    removal of partial bone of bilateral small toes    OB History    Gravida Para Term Preterm AB Living   1 1           SAB TAB Ectopic Multiple Live Births                   Home Medications    Prior to Admission medications   Medication Sig Start Date End Date Taking? Authorizing Provider  aspirin-acetaminophen-caffeine (EXCEDRIN MIGRAINE) [phone] MG tablet Take 2 tablets by mouth every 4 (four) hours as needed for headache.    [provider]  bismuth subsalicylate (PEPTO BISMOL) 262 MG/15ML suspension Take 30 mLs by mouth daily as needed for indigestion or diarrhea or loose stools.    [provider]  Multiple Vitamin (MULTIVITAMIN WITH MINERALS) TABS tablet Take 1 tablet by mouth daily.    [provider]  psyllium (METAMUCIL) 58.6 % packet Take 1 packet by mouth as needed.    [provider]    Family History Family History  Problem Relation Age of Onset  .  Alcohol abuse Father   . Hypertension Father   . Cancer Mother        colon. was in her late 40s/early 36s.  . Hypertension Mother   . Fibroids Mother   . Hypertension Maternal Grandmother   . Fibroids Maternal Grandmother   . Hypertension Paternal Grandmother   . Hypertension Brother   . Stroke Maternal Uncle   . Celiac disease Neg Hx   . Inflammatory bowel disease Neg Hx     Social History Social History   Tobacco Use  . Smoking status: Never Smoker  . Smokeless tobacco: Never Used  Substance Use Topics  . Alcohol use: No  . Drug use: No     Allergies   Strawberry extract; Strawberry flavor; Strawberry (diagnostic); and Sulfonamide  derivatives   Review of Systems Review of Systems  Constitutional: Negative for appetite change, chills and fever.  HENT: Negative for congestion, rhinorrhea and sore throat.   Eyes: Positive for redness and visual disturbance. Negative for photophobia.  Respiratory: Negative for cough and shortness of breath.   Cardiovascular: Negative for chest pain.  Gastrointestinal: Negative for nausea and vomiting.  Musculoskeletal: Negative for arthralgias.  Neurological: Negative for dizziness, syncope, facial asymmetry, speech difficulty, weakness, numbness and headaches.  Psychiatric/Behavioral: Negative for confusion.     Physical Exam Updated Vital Signs BP (!) 156/102 (BP Location: Right Arm)   Temp 98.3 F (36.8 C) (Oral)   Resp 16   Ht 5\' 7"  (1.702 m)   Wt 95.3 kg (210 lb)   SpO2 99%   BMI 32.89 kg/m   Physical Exam  Constitutional: She is oriented to person, place, and time. She appears well-developed and well-nourished. No distress.  HENT:  Head: Normocephalic and atraumatic.  Eyes: EOM and lids are normal. Pupils are equal, round, and reactive to light. Lids are everted and swept, no foreign bodies found. Right eye exhibits no chemosis, no exudate and no hordeolum. Left eye exhibits no chemosis, no exudate and no hordeolum. Right conjunctiva is not injected. Right conjunctiva has a hemorrhage. Left conjunctiva is not injected. Left conjunctiva has a hemorrhage.    Bilateral sub-conjunctival hemorrhages with small hemorrhage to the right and larger hemorrhage to the left.  No tenderness, erythema or edema of the bilateral eyelids  Neck: Normal range of motion. Neck supple. No thyromegaly present.  Cardiovascular: Normal rate, regular rhythm and intact distal pulses.  No murmur heard. Pulmonary/Chest: Effort normal and breath sounds normal. No respiratory distress.  Musculoskeletal: Normal range of motion.  Lymphadenopathy:    She has no cervical adenopathy.  Neurological:  She is alert and oriented to person, place, and time. She exhibits normal muscle tone. Coordination normal.  Skin: Skin is warm and dry. No rash noted.  Psychiatric: She has a normal mood and affect.  Nursing note and vitals reviewed.    ED Treatments / Results  Labs (all labs ordered are listed, but only abnormal results are displayed) Labs Reviewed - No data to display  EKG  EKG Interpretation None       Radiology No results found.  Procedures Procedures (including critical care time)   Vitals:   06/22/17 1357 06/22/17 1511  BP: (!) 156/102 (!) 142/97  Pulse:  62  Resp: 16 16  Temp: 98.3 F (36.8 C)   SpO2: 99% 97%      Visual Acuity   Right Eye Near: R Near: 20/30 Left Eye Near:  L Near: 20/30 Bilateral Near:  20/25(without reading glasses)  Medications Ordered in ED Medications - No data to display   Initial Impression / Assessment and Plan / ED Course  I have reviewed the triage vital signs and the nursing notes.  Pertinent labs & imaging results that were available during my care of the patient were reviewed by me and considered in my medical decision making (see chart for details).     Patient well-appearing.  Vitals stable.  No focal neuro deficits.  Bilateral subconjunctival hemorrhages with left greater than right.  Patient reassured.  Advised to use an over-the-counter lubricating drop if needed.  Referral information given for ophthalmology if needed.  Final Clinical Impressions(s) / ED Diagnoses   Final diagnoses:  Subconjunctival hemorrhage of left eye  Subconjunctival hemorrhage of right eye    ED Discharge Orders    None       Bufford Lope 06/22/17 1525    Milton Ferguson, MD 06/23/17 [phone]

## 2017-06-22 NOTE — ED Triage Notes (Signed)
Pt states she woke up yesterday with ruptured blood vessels in both her eyes, noticeable in L eye. Also reports blurred vision in L eye and pain.

## 2017-06-25 DIAGNOSIS — Z1389 Encounter for screening for other disorder: Secondary | ICD-10-CM | POA: Diagnosis not present

## 2017-06-25 DIAGNOSIS — I10 Essential (primary) hypertension: Secondary | ICD-10-CM | POA: Diagnosis not present

## 2017-06-25 DIAGNOSIS — Z6832 Body mass index (BMI) 32.0-32.9, adult: Secondary | ICD-10-CM | POA: Diagnosis not present

## 2017-06-25 DIAGNOSIS — E6609 Other obesity due to excess calories: Secondary | ICD-10-CM | POA: Diagnosis not present

## 2017-06-28 NOTE — Progress Notes (Signed)
Please let patient know her ct was unremarkable.  Would recommend trial of bentyl 10mg  before meals and at bedtime (qid) for abd cramping. #120 and 5 refills.  F/u with dr. Gala Romney only in 2 months.

## 2017-06-30 NOTE — Progress Notes (Signed)
ON RECALL  °

## 2017-07-01 ENCOUNTER — Other Ambulatory Visit: Payer: Self-pay

## 2017-07-01 MED ORDER — DICYCLOMINE HCL 10 MG PO CAPS: 10.0000 mg | ORAL_CAPSULE | Freq: Three times a day (TID) | ORAL | 5 refills | Status: DC

## 2017-07-01 NOTE — Progress Notes (Signed)
PATIENT ALSO ON RECALL AND WILL RECEIVE LETTER TO SCHEDULE APPOINTMENT

## 2017-08-01 ENCOUNTER — Ambulatory Visit (HOSPITAL_COMMUNITY)
Admission: RE | Admit: 2017-08-01 | Discharge: 2017-08-01 | Disposition: A | Discharge status: Home / Self Care | Payer: BLUE CROSS/BLUE SHIELD | Source: Ambulatory Visit | Attending: Family Medicine | Admitting: Family Medicine

## 2017-08-01 ENCOUNTER — Other Ambulatory Visit (HOSPITAL_COMMUNITY): Payer: Self-pay | Admitting: Family Medicine

## 2017-08-01 DIAGNOSIS — Z6833 Body mass index (BMI) 33.0-33.9, adult: Secondary | ICD-10-CM | POA: Diagnosis not present

## 2017-08-01 DIAGNOSIS — E6609 Other obesity due to excess calories: Secondary | ICD-10-CM | POA: Diagnosis not present

## 2017-08-01 DIAGNOSIS — M7989 Other specified soft tissue disorders: Secondary | ICD-10-CM

## 2017-08-01 DIAGNOSIS — M25572 Pain in left ankle and joints of left foot: Secondary | ICD-10-CM | POA: Diagnosis not present

## 2017-08-01 DIAGNOSIS — Z1389 Encounter for screening for other disorder: Secondary | ICD-10-CM | POA: Diagnosis not present

## 2017-08-15 ENCOUNTER — Other Ambulatory Visit (HOSPITAL_COMMUNITY)
Admission: RE | Admit: 2017-08-15 | Discharge: 2017-08-15 | Disposition: A | Discharge status: Home / Self Care | Payer: BLUE CROSS/BLUE SHIELD | Source: Ambulatory Visit | Attending: Internal Medicine | Admitting: Internal Medicine

## 2017-08-15 DIAGNOSIS — Z1389 Encounter for screening for other disorder: Secondary | ICD-10-CM | POA: Diagnosis not present

## 2017-08-15 DIAGNOSIS — G64 Other disorders of peripheral nervous system: Secondary | ICD-10-CM | POA: Diagnosis not present

## 2017-08-15 DIAGNOSIS — R600 Localized edema: Secondary | ICD-10-CM | POA: Insufficient documentation

## 2017-08-15 DIAGNOSIS — Z681 Body mass index (BMI) 19 or less, adult: Secondary | ICD-10-CM | POA: Diagnosis not present

## 2017-08-15 LAB — CBC WITH DIFFERENTIAL/PLATELET
BASOS ABS: 0 10*3/uL (ref 0.0–0.1)
Basophils Relative: 0 %
Eosinophils Absolute: 0.1 10*3/uL (ref 0.0–0.7)
Eosinophils Relative: 1 %
HEMATOCRIT: 34.9 % — AB (ref 36.0–46.0)
HEMOGLOBIN: 11.7 g/dL — AB (ref 12.0–15.0)
LYMPHS PCT: 37 %
Lymphs Abs: 4.7 10*3/uL — ABNORMAL HIGH (ref 0.7–4.0)
MCH: 29 pg (ref 26.0–34.0)
MCHC: 33.5 g/dL (ref 30.0–36.0)
MCV: 86.6 fL (ref 78.0–100.0)
MONO ABS: 0.8 10*3/uL (ref 0.1–1.0)
Monocytes Relative: 6 %
NEUTROS ABS: 7.3 10*3/uL (ref 1.7–7.7)
NEUTROS PCT: 56 %
Platelets: 312 10*3/uL (ref 150–400)
RBC: 4.03 MIL/uL (ref 3.87–5.11)
RDW: 13.5 % (ref 11.5–15.5)
WBC: 12.8 10*3/uL — ABNORMAL HIGH (ref 4.0–10.5)

## 2017-08-15 LAB — COMPREHENSIVE METABOLIC PANEL
ALBUMIN: 3.6 g/dL (ref 3.5–5.0)
ALK PHOS: 67 U/L (ref 38–126)
ALT: 15 U/L (ref 14–54)
AST: 13 U/L — ABNORMAL LOW (ref 15–41)
Anion gap: 11 (ref 5–15)
BILIRUBIN TOTAL: 0.4 mg/dL (ref 0.3–1.2)
BUN: 17 mg/dL (ref 6–20)
CALCIUM: 8.3 mg/dL — AB (ref 8.9–10.3)
CO2: 25 mmol/L (ref 22–32)
Chloride: 103 mmol/L (ref 101–111)
Creatinine, Ser: 0.94 mg/dL (ref 0.44–1.00)
GFR calc Af Amer: >60 mL/min (ref >60)
GLUCOSE: 90 mg/dL (ref 65–99)
POTASSIUM: 3.5 mmol/L (ref 3.5–5.1)
Sodium: 139 mmol/L (ref 135–145)
TOTAL PROTEIN: 6.9 g/dL (ref 6.5–8.1)

## 2017-08-15 LAB — TSH: TSH: 3.396 u[IU]/mL (ref 0.350–4.500)

## 2017-08-15 LAB — LIPID PANEL
CHOL/HDL RATIO: 3.1 ratio
CHOLESTEROL: 140 mg/dL (ref 0–200)
HDL: 45 mg/dL (ref >40)
LDL Cholesterol: 87 mg/dL (ref 0–99)
Triglycerides: 38 mg/dL (ref <150)
VLDL: 8 mg/dL (ref 0–40)

## 2017-08-15 LAB — SEDIMENTATION RATE: Sed Rate: 22 mm/hr (ref 0–22)

## 2017-09-02 ENCOUNTER — Telehealth: Payer: Self-pay | Admitting: Internal Medicine

## 2017-09-02 ENCOUNTER — Ambulatory Visit: Payer: Medicaid Other | Admitting: Internal Medicine

## 2017-09-02 NOTE — Telephone Encounter (Signed)
[phone] please call patient, she has questions about her medication

## 2017-09-02 NOTE — Telephone Encounter (Signed)
Lmom, waiting on a return call.  

## 2017-09-02 NOTE — Telephone Encounter (Signed)
She missed her appt with RMR today.   Bentyl provided for abd pain and diarrhea. I'm not sure if her side effects were due to bentyl but would be reasonable to avoid use.   Please find out if patient is still having diarrhea. If so, trial of Xifaxan 550mg  TID for 14 days. Indication needs to be marked on RX to be IBS-D.

## 2017-09-02 NOTE — Telephone Encounter (Signed)
Spoke with pt and she is aware that she missed her appointment today. Her next appointment is 12/09/17. Pt wanted LSL to know that she D/c her Bentyl 3 days ago due to side affects. Pts PCP has changed her BP meds twice due to thinking the side affects pt was having was due to the BP meds. Pt has c/o rapid pulse, even when resting, swelling of joints and  Rash/itching. Pt feels Bentyl hasn't helped any of her symptoms, since she started taking them almost 2 months ago. Please advise.

## 2017-09-02 NOTE — Telephone Encounter (Signed)
Pt said she has never had diarrhea symptoms. Pt states she has had abdominal cramps constipation previously. Pt said prior to 2 years ago, she use to have a bowel movement only once weekly and now over the last 2 years, she has bowel moments twice daily.

## 2017-09-04 NOTE — Telephone Encounter (Signed)
I think at this point, we need patient to come back in for reevaluation of her GI issues. If ongoing rash/joint swelling, rapid heart rate she should f/u with PCP.

## 2017-09-05 NOTE — Telephone Encounter (Signed)
Spoke with pt and made aware of recs of below. She voiced understanding. She will call back to schedule appt

## 2017-09-08 NOTE — Telephone Encounter (Signed)
Noted  

## 2017-09-15 ENCOUNTER — Encounter: Payer: Self-pay | Admitting: Internal Medicine

## 2017-10-08 DIAGNOSIS — Z6833 Body mass index (BMI) 33.0-33.9, adult: Secondary | ICD-10-CM | POA: Diagnosis not present

## 2017-10-08 DIAGNOSIS — L309 Dermatitis, unspecified: Secondary | ICD-10-CM | POA: Diagnosis not present

## 2017-10-08 DIAGNOSIS — I10 Essential (primary) hypertension: Secondary | ICD-10-CM | POA: Diagnosis not present

## 2017-10-08 DIAGNOSIS — R000 Tachycardia, unspecified: Secondary | ICD-10-CM | POA: Diagnosis not present

## 2017-10-08 DIAGNOSIS — Z1389 Encounter for screening for other disorder: Secondary | ICD-10-CM | POA: Diagnosis not present

## 2017-10-08 DIAGNOSIS — E6609 Other obesity due to excess calories: Secondary | ICD-10-CM | POA: Diagnosis not present

## 2017-10-09 ENCOUNTER — Encounter: Payer: Self-pay | Admitting: Advanced Practice Midwife

## 2017-10-09 ENCOUNTER — Ambulatory Visit (INDEPENDENT_AMBULATORY_CARE_PROVIDER_SITE_OTHER): Payer: BLUE CROSS/BLUE SHIELD | Admitting: Advanced Practice Midwife

## 2017-10-09 VITALS — BP 97/54 | HR 73 | Ht 67.0 in | Wt 218.0 lb

## 2017-10-09 DIAGNOSIS — A599 Trichomoniasis, unspecified: Secondary | ICD-10-CM | POA: Diagnosis not present

## 2017-10-09 MED ORDER — METRONIDAZOLE 500 MG PO TABS: 500.0000 mg | ORAL_TABLET | Freq: Two times a day (BID) | ORAL | 0 refills | Status: AC

## 2017-10-09 NOTE — Patient Instructions (Signed)
Trichomonas Test Why am I having this test? The trichomonas test is done to diagnose trichomoniasis, an infection caused by an organism called Trichomonas. Trichomoniasis is a sexually transmitted infection (STI). In women, it causes vaginal infections. In men, it can cause the tube that carries urine (urethra) to become inflamed (urethritis). You may have this test as a part of a routine screening for STIs or if you have symptoms of trichomoniasis. To perform the test, your health care provider will take a sample of discharge. The sample is taken from the vagina or cervix in women and from the urethra in men. A urine sample can also be used for testing. What do the results mean? It is your responsibility to obtain your test results. Ask the lab or department performing the test when and how you will get your results. Contact your health care provider to discuss any questions you have about your results. Negative test results A negative test means you do not have trichomoniasis. Follow your health care provider's directions about any follow-up testing. Positive test results A positive test result means you have an active infection that needs to be treated with antibiotic medicine. All your current sexual partners must also be treated or it is likely you will get reinfected. If your test is positive, your health care provider will start you on medicine and may advise you to:  Not have sexual intercourse until your infection has cleared up.  Use a latex condom properly every time you have sexual intercourse.  Limit the number of sexual partners you have. The more partners you have, the greater your risk of contracting trichomoniasis or another STI.  Tell all sexual partners about your infection so they can also be treated and to prevent reinfection.  Talk with your health care provider to discuss your results, treatment options, and if necessary, the need for more tests. Talk with your health care  provider if you have any questions about your results. This information is not intended to replace advice given to you by your health care provider. Make sure you discuss any questions you have with your health care provider. Document Released: 05/18/2004 Document Revised: 11/15/2015 Document Reviewed: 04/27/2013 Elsevier Interactive Patient Education  2017 Elsevier Inc.  

## 2017-10-09 NOTE — Progress Notes (Signed)
[facility] Visit  Patient name: Sara Pruitt MRN [id number]  Date of birth: 08/19/1975  CC & HPI:  Sara Pruitt is a 42 y.o. African American female presenting today for vaginal odor for 2 weeks. No itching or irritation  Pertinent History Reviewed:  Medical & Surgical Hx:   Past Medical History:  Diagnosis Date  . Abnormal pap   . Chronic fatigue   . Constipation    ? IBS  . Fibroids    uterine  . GERD (gastroesophageal reflux disease)   . History of anemia   . Hypertension   . Migraines   . S/P colonoscopy June 07, 2010   Dr. Gala Romney: secondary to constipation, normal. 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 Sickle cell trait [facility])    Past Surgical History:  Procedure Laterality Date  . BILATERAL SALPINGECTOMY Bilateral 11/24/2013   Procedure: BILATERAL SALPINGECTOMY;  Surgeon: Florian Buff, MD;  Location: [facility];  Service: Gynecology;  Laterality: Bilateral;  . COLONOSCOPY N/A 04/10/2017   Normal. next TCS in 5 years due to [place] of colon cancer  . 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 SUPRACERVICAL ABDOMINAL HYSTERECTOMY N/A 11/24/2013   Procedure: HYSTERECTOMY SUPRACERVICAL ABDOMINAL;  Surgeon: Florian Buff, MD;  Location: [facility];  Service: Gynecology;  Laterality: N/A;  . TOE SURGERY Bilateral    removal of partial bone of bilateral small toes   Family History  Problem Relation Age of Onset  . Alcohol abuse Father   . Hypertension Father   . Cancer Mother        colon. was in her late 40s/early 37s.  . Hypertension Mother   . Fibroids Mother   . Hypertension Maternal Grandmother   . Fibroids Maternal Grandmother   . Hypertension Paternal Grandmother   . Hypertension Brother   . Stroke Maternal Uncle   . Celiac disease Neg Hx   . Inflammatory bowel disease Neg Hx     Current Outpatient Medications:  .  aspirin-acetaminophen-caffeine (EXCEDRIN MIGRAINE) 250-250-65 MG tablet, Take 2 tablets by mouth every 4 (four) hours as needed for  headache., Disp: , Rfl:  .  Multiple Vitamin (MULTIVITAMIN WITH MINERALS) TABS tablet, Take 1 tablet by mouth daily., Disp: , Rfl:  .  bismuth subsalicylate (PEPTO BISMOL) 262 MG/15ML suspension, Take 30 mLs by mouth daily as needed for indigestion or diarrhea or loose stools., Disp: , Rfl:  .  dicyclomine (BENTYL) 10 MG capsule, Take 1 capsule (10 mg total) by mouth 4 (four) times daily -  with meals and at bedtime. (Patient not taking: Reported on 10/09/2017), Disp: 120 capsule, Rfl: 5 .  metroNIDAZOLE (FLAGYL) 500 MG tablet, Take 1 tablet (500 mg total) by mouth 2 (two) times daily for 7 days., Disp: 14 tablet, Rfl: 0 .  psyllium (METAMUCIL) 58.6 % packet, Take 1 packet by mouth as needed., Disp: , Rfl:  Social History: Reviewed -  reports that she has never smoked. She has never used smokeless tobacco.  Review of Systems:   Constitutional: Negative for fever and chills Eyes: Negative for visual disturbances Respiratory: Negative for shortness of breath, dyspnea Cardiovascular: Negative for chest pain or palpitations  Gastrointestinal: Negative for vomiting, diarrhea and constipation; no abdominal pain Genitourinary: Negative for dysuria and urgency, vaginal irritation or itching Musculoskeletal: Negative for back pain, joint pain, myalgias  Neurological: Negative for dizziness and headaches    Objective Findings:    Physical Examination: Vitals:   10/09/17 1152  BP: Marland Kitchen)  97/54  Pulse: 73   General appearance - well appearing, and in no distress Mental status - alert, oriented to person, place, and time Chest:  Normal respiratory effort Heart - normal rate and regular rhythm Abdomen:  Soft, nontender Pelvic: SSE:  Thin white frothy discharge  Wet prep + trich/WBC, no clue or yeast Musculoskeletal:  Normal range of motion without pain Extremities:  No edema    No results found for this or any previous visit (from the past 24 hour(s)).    Assessment & Plan:  A:    Trich P:  rx flagyl 500mg  BID X7; TOC 4 weeks Partner needs to be treated   Return in about 1 month (around 11/06/2017) for lab visit TOC trich.  Christin Fudge CNM 10/09/2017 12:16 PM

## 2017-10-10 DIAGNOSIS — Z1211 Encounter for screening for malignant neoplasm of colon: Secondary | ICD-10-CM | POA: Diagnosis not present

## 2017-10-11 LAB — GC/CHLAMYDIA PROBE AMP
CHLAMYDIA, DNA PROBE: NEGATIVE
Neisseria gonorrhoeae by PCR: NEGATIVE

## 2017-11-06 ENCOUNTER — Other Ambulatory Visit: Payer: BLUE CROSS/BLUE SHIELD

## 2017-11-06 ENCOUNTER — Telehealth: Payer: Self-pay | Admitting: Obstetrics & Gynecology

## 2017-11-06 DIAGNOSIS — A599 Trichomoniasis, unspecified: Secondary | ICD-10-CM

## 2017-11-06 DIAGNOSIS — Z113 Encounter for screening for infections with a predominantly sexual mode of transmission: Secondary | ICD-10-CM | POA: Diagnosis not present

## 2017-11-08 LAB — TRICHOMONAS VAGINALIS, PROBE AMP: TRICH VAG BY NAA: NEGATIVE

## 2017-12-09 ENCOUNTER — Ambulatory Visit: Payer: Medicaid Other | Admitting: Internal Medicine

## 2017-12-23 ENCOUNTER — Encounter: Payer: Self-pay | Admitting: Internal Medicine

## 2017-12-23 ENCOUNTER — Ambulatory Visit: Payer: BLUE CROSS/BLUE SHIELD | Admitting: Internal Medicine

## 2017-12-23 VITALS — BP 122/77 | HR 64 | Temp 98.5°F | Ht 67.0 in | Wt 222.4 lb

## 2017-12-23 DIAGNOSIS — K580 Irritable bowel syndrome with diarrhea: Secondary | ICD-10-CM | POA: Diagnosis not present

## 2017-12-23 NOTE — Progress Notes (Signed)
Primary Care Physician:  Redmond School, MD Primary Gastroenterologist:  Dr. Gala Romney  Pre-Procedure History & Physical: HPI:  Sara Pruitt is a 42 y.o. female here for altered bowel function. Long history of symptoms consistent with constipation predominant irritable bowel syndrome.  Iinsidiously over the last year so, bowel function has reverted to postprandial abdominal cramps and diarrhea. No bleeding. Negative colonoscopy. Negative CT scan. Negative stool studies and serological celiac screen. Recent trial of dicyclomine produced tachycardia  -   she stopped taking it. Stopped taking fiber supplement - secondary to diarrhea.  Has a sporadic work and eating schedule. 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 She basically lives there 24/ 7 for 7 days and then off for 7 days. Recently treated for a trichomonas infection. No history of pancreatitis. Gallbladder remains in situ.   Past Medical History:  Diagnosis Date  . Abnormal pap   . Chronic fatigue   . Constipation    ? IBS  . Fibroids    uterine  . GERD (gastroesophageal reflux disease)   . History of anemia   . Hypertension   . Migraines   . S/P colonoscopy June 07, 2010   Dr. Gala Romney: secondary to constipation, normal. 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 Sickle cell trait [facility])     Past Surgical History:  Procedure Laterality Date  . BILATERAL SALPINGECTOMY Bilateral 11/24/2013   Procedure: BILATERAL SALPINGECTOMY;  Surgeon: Florian Buff, MD;  Location: [facility];  Service: Gynecology;  Laterality: Bilateral;  . COLONOSCOPY N/A 04/10/2017   Normal. next TCS in 5 years due to [place] of colon cancer  . 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 SUPRACERVICAL ABDOMINAL HYSTERECTOMY N/A 11/24/2013   Procedure: HYSTERECTOMY SUPRACERVICAL ABDOMINAL;  Surgeon: Florian Buff, MD;  Location: [facility];  Service: Gynecology;  Laterality: N/A;  . TOE SURGERY Bilateral    removal of partial bone of bilateral small toes     Prior to Admission medications   Medication Sig Start Date End Date Taking? Authorizing Provider  aspirin-acetaminophen-caffeine (EXCEDRIN MIGRAINE) [phone] MG tablet Take 2 tablets by mouth every 4 (four) hours as needed for headache.   Yes [provider]  bismuth subsalicylate (PEPTO BISMOL) 262 MG/15ML suspension Take 30 mLs by mouth daily as needed for indigestion or diarrhea or loose stools.   Yes [provider]  hydrochlorothiazide (HYDRODIURIL) 25 MG tablet Take 0.5 tablets by mouth daily. 10/02/17  Yes [provider]  metoprolol tartrate (LOPRESSOR) 25 MG tablet Take 0.5 tablets by mouth daily. 10/08/17  Yes [provider]  Multiple Vitamin (MULTIVITAMIN WITH MINERALS) TABS tablet Take 1 tablet by mouth daily.   Yes [provider]  dicyclomine (BENTYL) 10 MG capsule Take 1 capsule (10 mg total) by mouth 4 (four) times daily -  with meals and at bedtime. Patient not taking: Reported on 10/09/2017 07/01/17   Mahala Menghini, PA-C  psyllium (METAMUCIL) 58.6 % packet Take 1 packet by mouth as needed.    [provider]    Allergies as of 12/23/2017 - Review Complete 12/23/2017  Allergen Reaction Noted  . Strawberry extract Itching 11/18/2013  . Strawberry flavor  06/19/2017  . Strawberry (diagnostic) Rash 06/22/2017  . Sulfonamide derivatives Hives and Rash     Family History  Problem Relation Age of Onset  . Alcohol abuse Father   . Hypertension Father   . Cancer Mother        colon. was in  her late 40s/early 46s.  . Hypertension Mother   . Fibroids Mother   . Hypertension Maternal Grandmother   . Fibroids Maternal Grandmother   . Hypertension Paternal Grandmother   . Hypertension Brother   . Stroke Maternal Uncle   . Celiac disease Neg Hx   . Inflammatory bowel disease Neg Hx     Social History   Socioeconomic History  . Marital status: Single    Spouse name: Not on file  . Number of children: Not on file   . Years of education: Not on file  . Highest education level: Not on file  Occupational History  . Not on file  Social Needs  . Financial resource strain: Not on file  . Food insecurity:    Worry: Not on file    Inability: Not on file  . Transportation needs:    Medical: Not on file    Non-medical: Not on file  Tobacco Use  . Smoking status: Never Smoker  . Smokeless tobacco: Never Used  Substance and Sexual Activity  . Alcohol use: No  . Drug use: No  . Sexual activity: Yes    Partners: Male    Birth control/protection: Surgical  Lifestyle  . Physical activity:    Days per week: Not on file    Minutes per session: Not on file  . Stress: Not on file  Relationships  . Social connections:    Talks on phone: Not on file    Gets together: Not on file    Attends religious service: Not on file    Active member of club or organization: Not on file    Attends meetings of clubs or organizations: Not on file    Relationship status: Not on file  . Intimate partner violence:    Fear of current or ex partner: Not on file    Emotionally abused: Not on file    Physically abused: Not on file    Forced sexual activity: Not on file  Other Topics Concern  . Not on file  Social History Narrative  . Not on file    Review of Systems: See HPI, otherwise negative ROS  Physical Exam: BP 122/77   Pulse 64   Temp 98.5 F (36.9 C) (Oral)   Ht 5\' 7"  (1.702 m)   Wt 222 lb 6.4 oz (100.9 kg)   BMI 34.83 kg/m  General:   Alert,  Well-developed, well-nourished, pleasant and cooperative in NAD Neck:  Supple; no masses or thyromegaly. No significant cervical adenopathy. Lungs:  Clear throughout to auscultation.   No wheezes, crackles, or rhonchi. No acute distress. Heart:  Regular rate and rhythm; no murmurs, clicks, rubs,  or gallops. Abdomen: Non-distended, normal bowel sounds.  Soft and nontender without appreciable mass or hepatosplenomegaly.  Pulses:  Normal pulses  noted. Extremities:  Without clubbing or edema.  Impression/Plan:  42 year old lady with postprandial abdominal cramps and nonbloody diarrhea most consistent with irritable bowel syndrome given the longevity of her symptoms and negative workup as outlined above.  Had a lengthy discussion with patient and her Mother, who accompanied her today, regarding the benign but chronic nature of this syndrome. I explained to them how there is no cure. Our goal is to minimize symptoms and maximize good days.  Recommendations:  Begin Viberzi 75 mg once daily x 5 days; then increase to twice daily thereafter - samples provided. I discussed side effects.  Call in with a progress report 3 weeks  Information on IBS provided  Office visit in 3 months      Notice: This dictation was prepared with Dragon dictation along with smaller phrase technology. Any transcriptional errors that result from this process are unintentional and may not be corrected upon review.

## 2017-12-23 NOTE — Patient Instructions (Signed)
Begin Viberzi 75 mg once daily x 5 days; then increase to twice daily thereafter - samples provided  Call in with a progress report 3 weeks  Information on IBS provided  Office visit in 3 months

## 2018-01-06 ENCOUNTER — Other Ambulatory Visit (HOSPITAL_COMMUNITY)
Admission: RE | Admit: 2018-01-06 | Discharge: 2018-01-06 | Disposition: A | Discharge status: Home / Self Care | Payer: BLUE CROSS/BLUE SHIELD | Source: Ambulatory Visit | Attending: Obstetrics & Gynecology | Admitting: Obstetrics & Gynecology

## 2018-01-06 ENCOUNTER — Ambulatory Visit (INDEPENDENT_AMBULATORY_CARE_PROVIDER_SITE_OTHER): Payer: BLUE CROSS/BLUE SHIELD | Admitting: Obstetrics & Gynecology

## 2018-01-06 ENCOUNTER — Encounter: Payer: Self-pay | Admitting: Obstetrics & Gynecology

## 2018-01-06 VITALS — BP 110/68 | HR 59 | Ht 67.0 in | Wt 223.0 lb

## 2018-01-06 DIAGNOSIS — N951 Menopausal and female climacteric states: Secondary | ICD-10-CM

## 2018-01-06 DIAGNOSIS — Z01419 Encounter for gynecological examination (general) (routine) without abnormal findings: Secondary | ICD-10-CM | POA: Insufficient documentation

## 2018-01-06 MED ORDER — LEVOTHYROXINE SODIUM 25 MCG PO TABS: 25.0000 ug | ORAL_TABLET | Freq: Every day | ORAL | 1 refills | Status: DC

## 2018-01-06 NOTE — Progress Notes (Signed)
Subjective:     Sara Pruitt is a 42 y.o. female here for a routine exam.  No LMP recorded. Patient has had a hysterectomy. G1P1 Birth Control Method:  hysterectomy Menstrual Calendar(currently): amenorrhea  Current complaints: feels like she is having hot flushes.   Current acute medical issues:  IBS   Recent Gynecologic History No LMP recorded. Patient has had a hysterectomy. Last Pap: today,   Last mammogram: ,    Past Medical History:  Diagnosis Date  . Abnormal pap   . Chronic fatigue   . Constipation    ? IBS  . Fibroids    uterine  . GERD (gastroesophageal reflux disease)   . History of anemia   . Hypertension   . Migraines   . S/P colonoscopy June 07, 2010   Dr. Gala Romney: secondary to constipation, normal. 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 Sickle cell trait [facility])     Past Surgical History:  Procedure Laterality Date  . BILATERAL SALPINGECTOMY Bilateral 11/24/2013   Procedure: BILATERAL SALPINGECTOMY;  Surgeon: Florian Buff, MD;  Location: [facility];  Service: Gynecology;  Laterality: Bilateral;  . COLONOSCOPY N/A 04/10/2017   Normal. next TCS in 5 years due to [place] of colon cancer  . 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 SUPRACERVICAL ABDOMINAL HYSTERECTOMY N/A 11/24/2013   Procedure: HYSTERECTOMY SUPRACERVICAL ABDOMINAL;  Surgeon: Florian Buff, MD;  Location: [facility];  Service: Gynecology;  Laterality: N/A;  . TOE SURGERY Bilateral    removal of partial bone of bilateral small toes    OB History    Gravida  1   Para  1   Term      Preterm      AB      Living        SAB      TAB      Ectopic      Multiple      Live Births              Social History   Socioeconomic History  . Marital status: Single    Spouse name: Not on file  . Number of children: Not on file  . Years of education: Not on file  . Highest education level: Not on file  Occupational History  . Not on file  Social Needs  . Financial resource strain: Not on  file  . Food insecurity:    Worry: Not on file    Inability: Not on file  . Transportation needs:    Medical: Not on file    Non-medical: Not on file  Tobacco Use  . Smoking status: Never Smoker  . Smokeless tobacco: Never Used  Substance and Sexual Activity  . Alcohol use: No  . Drug use: No  . Sexual activity: Yes    Partners: Male    Birth control/protection: Surgical  Lifestyle  . Physical activity:    Days per week: Not on file    Minutes per session: Not on file  . Stress: Not on file  Relationships  . Social connections:    Talks on phone: Not on file    Gets together: Not on file    Attends religious service: Not on file    Active member of club or organization: Not on file    Attends meetings of clubs or organizations: Not on file    Relationship status: Not on file  Other Topics Concern  . Not on file  Social  History Narrative  . Not on file    Family History  Problem Relation Age of Onset  . Alcohol abuse Father   . Hypertension Father   . Cancer Mother        colon. was in her late 40s/early 80s.  . Hypertension Mother   . Fibroids Mother   . Hypertension Maternal Grandmother   . Fibroids Maternal Grandmother   . Hypertension Paternal Grandmother   . Hypertension Brother   . Stroke Maternal Uncle   . Celiac disease Neg Hx   . Inflammatory bowel disease Neg Hx      Current Outpatient Medications:  .  hydrochlorothiazide (HYDRODIURIL) 25 MG tablet, Take 0.5 tablets by mouth daily., Disp: , Rfl: 1 .  metoprolol tartrate (LOPRESSOR) 25 MG tablet, Take 0.5 tablets by mouth daily., Disp: , Rfl: 10 .  aspirin-acetaminophen-caffeine (EXCEDRIN MIGRAINE) 250-250-65 MG tablet, Take 2 tablets by mouth every 4 (four) hours as needed for headache., Disp: , Rfl:  .  bismuth subsalicylate (PEPTO BISMOL) 262 MG/15ML suspension, Take 30 mLs by mouth daily as needed for indigestion or diarrhea or loose stools., Disp: , Rfl:  .  dicyclomine (BENTYL) 10 MG capsule,  Take 1 capsule (10 mg total) by mouth 4 (four) times daily -  with meals and at bedtime. (Patient not taking: Reported on 10/09/2017), Disp: 120 capsule, Rfl: 5 .  levothyroxine (SYNTHROID) 25 MCG tablet, Take 1 tablet (25 mcg total) by mouth daily before breakfast., Disp: 30 tablet, Rfl: 1 .  Multiple Vitamin (MULTIVITAMIN WITH MINERALS) TABS tablet, Take 1 tablet by mouth daily., Disp: , Rfl:  .  psyllium (METAMUCIL) 58.6 % packet, Take 1 packet by mouth as needed., Disp: , Rfl:   Review of Systems  Review of Systems  Constitutional: Negative for fever, chills, weight loss, malaise/fatigue and diaphoresis.  HENT: Negative for hearing loss, ear pain, nosebleeds, congestion, sore throat, neck pain, tinnitus and ear discharge.   Eyes: Negative for blurred vision, double vision, photophobia, pain, discharge and redness.  Respiratory: Negative for cough, hemoptysis, sputum production, shortness of breath, wheezing and stridor.   Cardiovascular: Negative for chest pain, palpitations, orthopnea, claudication, leg swelling and PND.  Gastrointestinal: negative for abdominal pain. Negative for heartburn, nausea, vomiting, diarrhea, constipation, blood in stool and melena.  Genitourinary: Negative for dysuria, urgency, frequency, hematuria and flank pain.  Musculoskeletal: Negative for myalgias, back pain, joint pain and falls.  Skin: Negative for itching and rash.  Neurological: Negative for dizziness, tingling, tremors, sensory change, speech change, focal weakness, seizures, loss of consciousness, weakness and headaches.  Endo/Heme/Allergies: Negative for environmental allergies and polydipsia. Does not bruise/bleed easily.  Psychiatric/Behavioral: Negative for depression, suicidal ideas, hallucinations, memory loss and substance abuse. The patient is not nervous/anxious and does not have insomnia.        Objective:  Blood pressure 110/68, pulse (!) 59, height 5\' 7"  (1.702 m), weight 223 lb (101.2  kg).   Physical Exam  Vitals reviewed. Constitutional: She is oriented to person, place, and time. She appears well-developed and well-nourished.  HENT:  Head: Normocephalic and atraumatic.        Right Ear: External ear normal.  Left Ear: External ear normal.  Nose: Nose normal.  Mouth/Throat: Oropharynx is clear and moist.  Eyes: Conjunctivae and EOM are normal. Pupils are equal, round, and reactive to light. Right eye exhibits no discharge. Left eye exhibits no discharge. No scleral icterus.  Neck: Normal range of motion. Neck supple. No tracheal deviation present. No  thyromegaly present.  Cardiovascular: Normal rate, regular rhythm, normal heart sounds and intact distal pulses.  Exam reveals no gallop and no friction rub.   No murmur heard. Respiratory: Effort normal and breath sounds normal. No respiratory distress. She has no wheezes. She has no rales. She exhibits no tenderness.  GI: Soft. Bowel sounds are normal. She exhibits no distension and no mass. There is no tenderness. There is no rebound and no guarding.  Genitourinary:  Breasts no masses skin changes or nipple changes bilaterally      Vulva is normal without lesions Vagina is pink moist without discharge Cervix normal Uterus is absent Adnexa is negative with normal sized ovaries   Musculoskeletal: Normal range of motion. She exhibits no edema and no tenderness.  Neurological: She is alert and oriented to person, place, and time. She has normal reflexes. She displays normal reflexes. No cranial nerve deficit. She exhibits normal muscle tone. Coordination normal.  Skin: Skin is warm and dry. No rash noted. No erythema. No pallor.  Psychiatric: She has a normal mood and affect. Her behavior is normal. Judgment and thought content normal.       Medications Ordered at today's visit: Meds ordered this encounter  Medications  . levothyroxine (SYNTHROID) 25 MCG tablet    Sig: Take 1 tablet (25 mcg total) by mouth daily  before breakfast.    Dispense:  30 tablet    Refill:  1    Other orders placed at today's visit: Orders Placed This Encounter  Procedures  . [place]      Assessment:    Healthy female exam.   will try empiric trial of synthroid and see response Plan:    Contraception: status post hysterectomy. Mammogram ordered. Follow up in: 2 years.     Return in about 2 years (around 01/07/2020) for yearly, with Dr Elonda Husky.

## 2018-01-07 LAB — FOLLICLE STIMULATING HORMONE: FSH: 7.7 m[IU]/mL

## 2018-01-08 LAB — CYTOLOGY - PAP
Chlamydia: NEGATIVE
Diagnosis: NEGATIVE
HPV: NOT DETECTED
NEISSERIA GONORRHEA: NEGATIVE

## 2018-01-16 ENCOUNTER — Telehealth: Payer: Self-pay | Admitting: Internal Medicine

## 2018-01-16 NOTE — Telephone Encounter (Signed)
Pt was calling to let us know that she has been taking Viberzi for 3 weeks and she isn't as constipated but is still having to push a little extra to have a BM. Please advise and call [phone] or [phone]

## 2018-01-19 NOTE — Telephone Encounter (Signed)
Pt returned call and  lm on VM. Returned pts call and lmom. Waiting on a return call.

## 2018-01-19 NOTE — Telephone Encounter (Signed)
Pt returned call back. Two  weeks after taking Viberzi, pt was doing well without the diarrhea/constipation. One  week about pt started having some constipation. Pt is able to have a bowel movement but it is only small balls with some pain when released from bowels. Pt isn't sure if she needs to reduce down to one pill of the Viberzi or if she needs to do something different. Pt was given samples only.

## 2018-01-19 NOTE — Telephone Encounter (Signed)
Lmom, waiting on a return call.  

## 2018-01-20 ENCOUNTER — Other Ambulatory Visit: Payer: Self-pay

## 2018-01-20 MED ORDER — ELUXADOLINE 75 MG PO TABS: 1.0000 | ORAL_TABLET | Freq: Two times a day (BID) | ORAL | 1 refills | Status: DC

## 2018-01-20 NOTE — Telephone Encounter (Signed)
Noted. Pt notified and will call for an update in 4 weeks. RX was faxed to pharmacy.

## 2018-01-20 NOTE — Telephone Encounter (Signed)
It looks like we have something to work with here as Lennox Pippins has helped.  If taking twice a day would back off to once daily for a couple weeks and see how things work.  If that is not quite enough I would then go with 1 dose daily alternating with 2 doses every other day(so, 2 doses today and 1 dose tomorrow and so forth).  Try this if taking it just once daily is not enough.  Let us know how she is doing in about 4 weeks.

## 2018-02-18 DIAGNOSIS — Z6834 Body mass index (BMI) 34.0-34.9, adult: Secondary | ICD-10-CM | POA: Diagnosis not present

## 2018-02-18 DIAGNOSIS — Z1389 Encounter for screening for other disorder: Secondary | ICD-10-CM | POA: Diagnosis not present

## 2018-02-18 DIAGNOSIS — E6609 Other obesity due to excess calories: Secondary | ICD-10-CM | POA: Diagnosis not present

## 2018-02-18 DIAGNOSIS — M542 Cervicalgia: Secondary | ICD-10-CM | POA: Diagnosis not present

## 2018-02-18 DIAGNOSIS — R51 Headache: Secondary | ICD-10-CM | POA: Diagnosis not present

## 2018-03-02 DIAGNOSIS — Z6834 Body mass index (BMI) 34.0-34.9, adult: Secondary | ICD-10-CM | POA: Diagnosis not present

## 2018-03-02 DIAGNOSIS — Z1389 Encounter for screening for other disorder: Secondary | ICD-10-CM | POA: Diagnosis not present

## 2018-03-02 DIAGNOSIS — R51 Headache: Secondary | ICD-10-CM | POA: Diagnosis not present

## 2018-03-02 DIAGNOSIS — E6609 Other obesity due to excess calories: Secondary | ICD-10-CM | POA: Diagnosis not present

## 2018-03-18 DIAGNOSIS — I10 Essential (primary) hypertension: Secondary | ICD-10-CM | POA: Diagnosis not present

## 2018-03-18 DIAGNOSIS — R208 Other disturbances of skin sensation: Secondary | ICD-10-CM | POA: Diagnosis not present

## 2018-03-18 DIAGNOSIS — G43709 Chronic migraine without aura, not intractable, without status migrainosus: Secondary | ICD-10-CM | POA: Diagnosis not present

## 2018-03-18 DIAGNOSIS — M79641 Pain in right hand: Secondary | ICD-10-CM | POA: Diagnosis not present

## 2018-03-23 ENCOUNTER — Other Ambulatory Visit (HOSPITAL_COMMUNITY): Payer: Self-pay | Admitting: Neurology

## 2018-03-23 DIAGNOSIS — G44229 Chronic tension-type headache, not intractable: Secondary | ICD-10-CM

## 2018-03-24 DIAGNOSIS — Z79899 Other long term (current) drug therapy: Secondary | ICD-10-CM | POA: Diagnosis not present

## 2018-03-24 DIAGNOSIS — D519 Vitamin B12 deficiency anemia, unspecified: Secondary | ICD-10-CM | POA: Diagnosis not present

## 2018-03-24 DIAGNOSIS — R7303 Prediabetes: Secondary | ICD-10-CM | POA: Diagnosis not present

## 2018-03-24 DIAGNOSIS — E559 Vitamin D deficiency, unspecified: Secondary | ICD-10-CM | POA: Diagnosis not present

## 2018-03-27 ENCOUNTER — Ambulatory Visit (HOSPITAL_COMMUNITY)
Admission: RE | Admit: 2018-03-27 | Discharge: 2018-03-27 | Disposition: A | Discharge status: Home / Self Care | Payer: BLUE CROSS/BLUE SHIELD | Source: Ambulatory Visit | Attending: Neurology | Admitting: Neurology

## 2018-03-27 DIAGNOSIS — G44229 Chronic tension-type headache, not intractable: Secondary | ICD-10-CM

## 2018-03-27 DIAGNOSIS — R51 Headache: Secondary | ICD-10-CM | POA: Diagnosis not present

## 2018-03-27 DIAGNOSIS — G4452 New daily persistent headache (NDPH): Secondary | ICD-10-CM | POA: Diagnosis not present

## 2018-04-06 DIAGNOSIS — Z0000 Encounter for general adult medical examination without abnormal findings: Secondary | ICD-10-CM | POA: Diagnosis not present

## 2018-04-06 DIAGNOSIS — E6609 Other obesity due to excess calories: Secondary | ICD-10-CM | POA: Diagnosis not present

## 2018-04-06 DIAGNOSIS — Z6835 Body mass index (BMI) 35.0-35.9, adult: Secondary | ICD-10-CM | POA: Diagnosis not present

## 2018-04-06 DIAGNOSIS — Z111 Encounter for screening for respiratory tuberculosis: Secondary | ICD-10-CM | POA: Diagnosis not present

## 2018-04-06 DIAGNOSIS — Z1389 Encounter for screening for other disorder: Secondary | ICD-10-CM | POA: Diagnosis not present

## 2018-04-28 DIAGNOSIS — M79641 Pain in right hand: Secondary | ICD-10-CM | POA: Diagnosis not present

## 2018-04-28 DIAGNOSIS — G43709 Chronic migraine without aura, not intractable, without status migrainosus: Secondary | ICD-10-CM | POA: Diagnosis not present

## 2018-04-28 DIAGNOSIS — R208 Other disturbances of skin sensation: Secondary | ICD-10-CM | POA: Diagnosis not present

## 2018-04-28 DIAGNOSIS — I10 Essential (primary) hypertension: Secondary | ICD-10-CM | POA: Diagnosis not present

## 2018-05-18 ENCOUNTER — Other Ambulatory Visit: Payer: Self-pay | Admitting: Neurology

## 2018-05-18 ENCOUNTER — Other Ambulatory Visit (HOSPITAL_COMMUNITY): Payer: Self-pay | Admitting: Neurology

## 2018-05-18 DIAGNOSIS — G932 Benign intracranial hypertension: Secondary | ICD-10-CM

## 2018-06-05 ENCOUNTER — Ambulatory Visit (HOSPITAL_COMMUNITY)
Admission: RE | Admit: 2018-06-05 | Discharge: 2018-06-05 | Disposition: A | Discharge status: Home / Self Care | Payer: BLUE CROSS/BLUE SHIELD | Source: Ambulatory Visit | Attending: Neurology | Admitting: Neurology

## 2018-06-05 ENCOUNTER — Encounter (HOSPITAL_COMMUNITY): Payer: Self-pay

## 2018-06-05 DIAGNOSIS — G932 Benign intracranial hypertension: Secondary | ICD-10-CM | POA: Insufficient documentation

## 2018-06-05 LAB — GLUCOSE, CSF: GLUCOSE CSF: 61 mg/dL (ref 40–70)

## 2018-06-05 LAB — CSF CELL COUNT WITH DIFFERENTIAL
RBC Count, CSF: 0 /mm3
Tube #: 4
WBC, CSF: 1 /mm3 (ref 0–5)

## 2018-06-05 LAB — PROTEIN, CSF: Total  Protein, CSF: 35 mg/dL (ref 15–45)

## 2018-06-05 MED ORDER — LIDOCAINE HCL (PF) 2 % IJ SOLN
INTRAMUSCULAR | Status: AC
  Filled 2018-06-05: qty 10

## 2018-06-05 MED ORDER — POVIDONE-IODINE 10 % EX SOLN
CUTANEOUS | Status: AC
  Filled 2018-06-05: qty 15

## 2018-06-05 NOTE — Procedures (Signed)
CLINICAL DATA: Benign intracranial hypertension  EXAM:  DIAGNOSTIC LUMBAR PUNCTURE UNDER FLUOROSCOPIC GUIDANCE  FLUOROSCOPY TIME: Fluoroscopy Time:  0 minutes, 18 seconds  Radiation Exposure Index (if provided by the fluoroscopic device):  2.6 mGy  Number of Acquired Spot Images: 0  PROCEDURE:  I discussed the risks (including hemorrhage, infection, headache, and nerve damage, among others), benefits, and alternatives to fluoroscopically guided lumbar puncture with the patient.  We specifically discussed the high technical likelihood of success of the procedure. The patient understood and elected to undergo the procedure.    Standard time-out was employed.  Following sterile skin prep and local anesthetic administration consisting of 1 percent lidocaine, a 20 gauge spinal needle was advanced without difficulty into the thecal sac at the at the L3-4 level.  Clear CSF was returned.  Opening pressure was 23 cm of water.   13 cc of clear CSF was collected.  Closing pressure was 14 cm of water.  The needle was subsequently removed and the skin cleansed and bandaged.  No immediate complications were observed.    IMPRESSION:  Technically successful fluoroscopically guided lumbar puncture, opening pressure 23 cm of water, closing pressure 14 cm of water.

## 2018-06-05 NOTE — Progress Notes (Signed)
Arrived from Radiology via stretcher with siderails up x2. Report from [facility]. C/O headache. Remains laying supine on stretcher. Mother, Ernst Spell, at bedside. Sprite and peanut butter crackers provided. Tolerated well.

## 2018-06-05 NOTE — Discharge Instructions (Signed)
Lumbar Puncture, Care After  This sheet gives you information about how to care for yourself after your procedure. Your health care provider may also give you more specific instructions. If you have problems or questions, contact your health care provider.  What can I expect after the procedure?  After the procedure, it is common to have:   Mild discomfort or pain at the puncture site.   A mild headache that is relieved with pain medicines.  Follow these instructions at home:  Activity     Lie down flat or rest for as long as directed by your health care provider.   Return to your normal activities as told by your health care provider. Ask your health care provider what activities are safe for you.   Avoid lifting anything heavier than 10 lb (4.5 kg) for at least 12 hours after the procedure.   Do not drive for 24 hours if you were given a medicine to help you relax (sedative) during your procedure.   Do not drive or use heavy machinery while taking prescription pain medicine.  Puncture site care   Remove or change your bandage (dressing) as told by your health care provider.   Check your puncture area every day for signs of infection. Check for:  ? More pain.  ? Redness or swelling.  ? Fluid or blood leaking from the puncture site.  ? Warmth.  ? Pus or a bad smell.  General instructions   Take over-the-counter and prescription medicines only as told by your health care provider.   Drink enough fluids to keep your urine clear or pale yellow. Your health care provider may recommend drinking caffeine to prevent a headache.   Keep all follow-up visits as told by your health care provider. This is important.  Contact a health care provider if:   You have fever or chills.   You have nausea or vomiting.   You have a headache that lasts for more than 2 days or does not get better with medicine.  Get help right away if:   You develop any of the following in your  legs:  ? Weakness.  ? Numbness.  ? Tingling.   You are unable to control when you urinate or have a bowel movement (incontinence).   You have signs of infection around your puncture site, such as:  ? More pain.  ? Redness or swelling.  ? Fluid or blood leakage.  ? Warmth.  ? Pus or a bad smell.   You are dizzy or you feel like you might faint.   You have a severe headache, especially when you sit or stand.  Summary   A lumbar puncture is a procedure in which a small needle is inserted into the lower back to remove fluid that surrounds the brain and spinal cord.   After this procedure, it is common to have a headache and pain around the needle insertion area.   Lying flat, staying hydrated, and drinking caffeine can help prevent headaches.   Monitor your needle insertion site for signs of infection, including warmth, fluid, or more pain.   Get help right away if you develop leg weakness, leg numbness, incontinence, or severe headaches.  This information is not intended to replace advice given to you by your health care provider. Make sure you discuss any questions you have with your health care provider.  Document Released: 04/20/2013 Document Revised: 05/29/2016 Document Reviewed: 05/29/2016  Elsevier Interactive Patient Education  2019 [employer]

## 2018-06-24 ENCOUNTER — Encounter: Payer: Self-pay | Admitting: Nurse Practitioner

## 2018-06-24 ENCOUNTER — Ambulatory Visit (INDEPENDENT_AMBULATORY_CARE_PROVIDER_SITE_OTHER): Payer: BC Managed Care – PPO | Admitting: Nurse Practitioner

## 2018-06-24 VITALS — BP 123/78 | HR 67 | Temp 98.4°F | Ht 67.0 in | Wt 215.0 lb

## 2018-06-24 DIAGNOSIS — R143 Flatulence: Secondary | ICD-10-CM | POA: Diagnosis not present

## 2018-06-24 DIAGNOSIS — K582 Mixed irritable bowel syndrome: Secondary | ICD-10-CM | POA: Diagnosis not present

## 2018-06-24 DIAGNOSIS — K589 Irritable bowel syndrome without diarrhea: Secondary | ICD-10-CM | POA: Insufficient documentation

## 2018-06-24 DIAGNOSIS — K625 Hemorrhage of anus and rectum: Secondary | ICD-10-CM | POA: Diagnosis not present

## 2018-06-24 NOTE — Progress Notes (Signed)
Referring Provider: Redmond School, MD Primary Care Physician:  Redmond School, MD Primary GI:  Dr. Gala Romney  Chief Complaint  Patient presents with  . Gas    HPI:   Sara Pruitt is a 43 y.o. female who presents for GERD.  Patient was last seen in our office 12/23/2017 for IBS with diarrhea.  Longstanding history of symptoms consistent with constipation predominant-year-old bowel syndrome but over the previous year has reverted to postprandial abdominal cramps and diarrhea.  Trial of dicyclomine produced tachycardia so she stopped taking it.  No hematochezia.  Recommended Viberzi 75 mg once daily then increase to twice daily thereafter.  Progress report in 3 weeks.  Information on IBS was given.  Colonoscopy up to date 2018, recommended 5 year repeat (2023).  The patient called our office indicating after taking Viberzi for 2 weeks she was doing well but then reverted to constipation a week after that.  At that point she was having small balls of stool and some pain when release from the bowels.  We recommended to decrease to once a day for couple weeks and see how this does.  Alternatively if 1 dose daily is not effective enough we could try 1 dose every other day alternating with 2 doses on the opposite days.  Requested progress report in 4 weeks.  No subsequent progress report was called our office.  Today she states she is not having GERD. She was supposed to follow-up for IBS and Viberzi but didn't. She is here to follow-up with that and also having a lot of flatulence. Has taken OTC Gas-X. Has not taken Beano. Has persistent abdominal pain. States she has a bowel movement about once a week, sometimes twice a week. Is taking Viberzi once a day. Denies any diarrhea. Admits some scant toilet tissue hematochezia, started within the last week or two; occurs about every 2-3 weeks. Does not have known hemorrhoids. Denies rectal symptoms suggestive of hemorrhoids. Denies chest pain,  dyspnea, dizziness, lightheadedness, syncope, near syncope. Denies any other upper or lower GI symptoms.  Past Medical History:  Diagnosis Date  . Abnormal pap   . Chronic fatigue   . Constipation    ? IBS  . Fibroids    uterine  . GERD (gastroesophageal reflux disease)   . History of anemia   . Hypertension   . Idiopathic intracranial hypertension   . Migraines   . S/P colonoscopy June 07, 2010   Dr. Gala Romney: secondary to constipation, normal. 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 Sickle cell trait [facility])     Past Surgical History:  Procedure Laterality Date  . BILATERAL SALPINGECTOMY Bilateral 11/24/2013   Procedure: BILATERAL SALPINGECTOMY;  Surgeon: Florian Buff, MD;  Location: [facility];  Service: Gynecology;  Laterality: Bilateral;  . COLONOSCOPY N/A 04/10/2017   Normal. next TCS in 5 years due to [place] of colon cancer  . 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 SUPRACERVICAL ABDOMINAL HYSTERECTOMY N/A 11/24/2013   Procedure: HYSTERECTOMY SUPRACERVICAL ABDOMINAL;  Surgeon: Florian Buff, MD;  Location: [facility];  Service: Gynecology;  Laterality: N/A;  . TOE SURGERY Bilateral    removal of partial bone of bilateral small toes    Current Outpatient Medications  Medication Sig Dispense Refill  . aspirin-acetaminophen-caffeine (EXCEDRIN MIGRAINE) 250-250-65 MG tablet Take 2 tablets by mouth every 4 (four) hours as needed for headache.    . Eluxadoline (VIBERZI) 75 MG TABS Take 1 tablet by mouth 2 (two) times daily.  Take 1 tablet (75mg ) twice daily. (Patient taking differently: Take 1 tablet by mouth daily. Take 1 tablet (75mg ) twice daily.) 60 tablet 1  . hydrochlorothiazide (HYDRODIURIL) 25 MG tablet Take 0.5 tablets by mouth daily.  1  . metoprolol tartrate (LOPRESSOR) 25 MG tablet Take 0.5 tablets by mouth daily.  10  . Multiple Vitamin (MULTIVITAMIN WITH MINERALS) TABS tablet Take 1 tablet by mouth daily.    Marland Kitchen TROKENDI XR 25 MG CP24 Take 1 capsule by mouth daily.     No current  facility-administered medications for this visit.     Allergies as of 06/24/2018 - Review Complete 06/24/2018  Allergen Reaction Noted  . Strawberry extract Itching 11/18/2013  . Strawberry flavor  06/19/2017  . Strawberry (diagnostic) Rash 06/22/2017  . Sulfonamide derivatives Hives and Rash     Family History  Problem Relation Age of Onset  . Alcohol abuse Father   . Hypertension Father   . Hypertension Mother   . Fibroids Mother   . Colon cancer Mother        Age 55.  Marland Kitchen Hypertension Maternal Grandmother   . Fibroids Maternal Grandmother   . Hypertension Paternal Grandmother   . Hypertension Brother   . Stroke Maternal Uncle   . Celiac disease Neg Hx   . Inflammatory bowel disease Neg Hx     Social History   Socioeconomic History  . Marital status: Single    Spouse name: Not on file  . Number of children: Not on file  . Years of education: Not on file  . Highest education level: Not on file  Occupational History  . Not on file  Social Needs  . Financial resource strain: Not on file  . Food insecurity:    Worry: Not on file    Inability: Not on file  . Transportation needs:    Medical: Not on file    Non-medical: Not on file  Tobacco Use  . Smoking status: Never Smoker  . Smokeless tobacco: Never Used  Substance and Sexual Activity  . Alcohol use: No  . Drug use: No  . Sexual activity: Yes    Partners: Male    Birth control/protection: Surgical  Lifestyle  . Physical activity:    Days per week: Not on file    Minutes per session: Not on file  . Stress: Not on file  Relationships  . Social connections:    Talks on phone: Not on file    Gets together: Not on file    Attends religious service: Not on file    Active member of club or organization: Not on file    Attends meetings of clubs or organizations: Not on file    Relationship status: Not on file  Other Topics Concern  . Not on file  Social History Narrative  . Not on file    Review of  Systems: General: Negative for anorexia, weight loss, fever, chills, fatigue, weakness. ENT: Negative for hoarseness, difficulty swallowing. CV: Negative for chest pain, angina, palpitations, peripheral edema.  Respiratory: Negative for dyspnea at rest, dcough, sputum, wheezing.  GI: See history of present illness. Endo: Negative for unusual weight change.  Heme: Negative for bruising or bleeding. Allergy: Negative for rash or hives.   Physical Exam: BP 123/78   Pulse 67   Temp 98.4 F (36.9 C) (Oral)   Ht 5\' 7"  (1.702 m)   Wt 215 lb (97.5 kg)   BMI 33.67 kg/m  General:   Alert and oriented.  Pleasant and cooperative. Well-nourished and well-developed.  Eyes:  Without icterus, sclera clear and conjunctiva pink.  Ears:  Normal auditory acuity. Cardiovascular:  S1, S2 present without murmurs appreciated. Extremities without clubbing or edema. Respiratory:  Clear to auscultation bilaterally. No wheezes, rales, or rhonchi. No distress.  Gastrointestinal:  +BS, soft, and non-distended. Mild generalized TTP. No HSM noted. No guarding or rebound. No masses appreciated.  Rectal:  Deferred  Musculoskalatal:  Symmetrical without gross deformities. Neurologic:  Alert and oriented x4;  grossly normal neurologically. Psych:  Alert and cooperative. Normal mood and affect. Heme/Lymph/Immune: No excessive bruising noted.    06/24/2018 2:52 PM   Disclaimer: This note was dictated with voice recognition software. Similar sounding words can inadvertently be transcribed and may not be corrected upon review.

## 2018-06-24 NOTE — Assessment & Plan Note (Signed)
Complains of excessive flatulence which is likely due to her IBS mixed type and fluctuations in stools.  Viberzi also has a 3% incidence of adverse effects including flatulence.  Gas-X has not helped.  I recommended she try Beano as an alternative.  When bowels are better controlled and moving more regularly I feel the flatulence will naturally work its way out.  We will see her in follow-up in 3 months.  She is to call us with any worsening symptoms before then.

## 2018-06-24 NOTE — Assessment & Plan Note (Signed)
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  Viberzi is now causing significant constipation even at once a day dosing.  Previously MiraLAX and/or Metamucil worked well for her constipation.  I will have her take Metamucil or MiraLAX as needed for constipation and Viberzi as needed for diarrhea.  Follow-up in 3 months.  Call for any severe symptoms or questions.

## 2018-06-24 NOTE — Assessment & Plan Note (Signed)
Intermittent/rare scant toilet tissue hematochezia likely due to fluctuations in bowel movements including constipation.  Colonoscopy is up-to-date 2018.  We will see her in follow-up and still having rectal bleeding we can consider any further work-up that may be necessary.

## 2018-06-24 NOTE — Patient Instructions (Addendum)
Your health issues we discussed today were:   Irritable bowel syndrome mixed type: 1. As we discussed, use Metamucil and/or MiraLAX as needed for constipation 2. You can take Viberzi 1-2 times a day as needed for diarrhea 3. Call us if you have any severe symptoms  Rectal bleeding: 1. I feel that the rectal bleeding is likely due to mild hemorrhoids as a result of your fluctuating bowel movements 2. Your colonoscopy was recently completed in 2018 and this is reassuring 3. We will see you in follow-up and if you are still having bleeding we can consider further work-up  Flatulence (excessive gas): 1. Again, I feel this is likely a combination of causes including your fluctuating bowel movements as well as adverse/side effects of Viberzi 2. I feel when your bowel movements are better controlled  Overall I recommend:  1. Follow-up in 3 months 2. Because you have any questions or concerns.  At [facility] we value your feedback. You may receive a survey about your visit today. Please share your experience as we strive to create trusting relationships with our patients to provide genuine, compassionate, quality care.  We appreciate your understanding and patience as we review any laboratory studies, imaging, and other diagnostic tests that are ordered as we care for you. Our office policy is 5 business days for review of these results, and any emergent or urgent results are addressed in a timely manner for your best interest. If you do not hear from our office in 1 week, please contact us.   We also encourage the use of MyChart, which contains your medical information for your review as well. If you are not enrolled in this feature, an access code is on this after visit summary for your convenience. Thank you for allowing [place] to be involved in your care.  It was great to see you today!  I hope you have a great day!!

## 2018-06-25 NOTE — Progress Notes (Signed)
cc'ed to pcp °

## 2018-09-30 ENCOUNTER — Ambulatory Visit: Payer: BC Managed Care – PPO | Admitting: Nurse Practitioner

## 2018-10-02 ENCOUNTER — Ambulatory Visit (INDEPENDENT_AMBULATORY_CARE_PROVIDER_SITE_OTHER): Payer: BC Managed Care – PPO | Admitting: Nurse Practitioner

## 2018-10-02 ENCOUNTER — Encounter: Payer: Self-pay | Admitting: Nurse Practitioner

## 2018-10-02 ENCOUNTER — Other Ambulatory Visit: Payer: Self-pay

## 2018-10-02 DIAGNOSIS — R1084 Generalized abdominal pain: Secondary | ICD-10-CM | POA: Insufficient documentation

## 2018-10-02 DIAGNOSIS — K625 Hemorrhage of anus and rectum: Secondary | ICD-10-CM

## 2018-10-02 DIAGNOSIS — K219 Gastro-esophageal reflux disease without esophagitis: Secondary | ICD-10-CM

## 2018-10-02 DIAGNOSIS — R634 Abnormal weight loss: Secondary | ICD-10-CM

## 2018-10-02 DIAGNOSIS — K582 Mixed irritable bowel syndrome: Secondary | ICD-10-CM

## 2018-10-02 NOTE — Assessment & Plan Note (Signed)
GERD symptoms currently doing well.  Continue current medications and follow-up in 6 to 8 weeks.

## 2018-10-02 NOTE — Assessment & Plan Note (Signed)
The patient notes a subjective weight loss of 15 to 20 pounds over the last 2 to 3 months even despite COVID-19/coronavirus pandemic and home quarantine with "eating a lot more than I usually do."  She is perplexed by her weight loss.  She does have a family history of colon cancer.  She has persistent rectal bleeding as per above.  At this point, given that her colonoscopy was last completed in 2018, I will check a CT of the abdomen and pelvis to further evaluate for unexplained weight loss and persistent rectal bleeding.  I will have her follow-up in 6 to 8 weeks.  Depending on her results, we may need to consider early interval repeat colonoscopy.  Further recommendations to follow.

## 2018-10-02 NOTE — Assessment & Plan Note (Signed)
Quite difficult to manage IBS mixed type with rapid alternation between constipation and diarrhea, sometimes in the same day.  She is not able to take Viberzi as needed given the brief diarrhea she will have.  Given this I recommended she start fiber supplementation taken out with both constipation and diarrhea.  I also recommend she start a probiotic.  We will see if this helps her IBS symptoms to any extent.  Follow-up in 6 to 8 weeks.

## 2018-10-02 NOTE — Assessment & Plan Note (Signed)
The patient is still having rectal bleeding about 1-2 times a week.  Sometimes more.  Colonoscopy is been completed as of 2018.  She does have a family history of colon cancer.  She is next due in 2023 for routine colonoscopy.  Denies overt hemorrhoid symptoms.  Of note her rectal bleeding is not particularly associated with episodes of constipation.  At this point I will check a CT of the abdomen pelvis first given her other symptoms as per below.  If unremarkable then we can consider repeat early interval colonoscopy for persistent rectal bleeding.

## 2018-10-02 NOTE — Patient Instructions (Signed)
Your health issues we discussed today were:   Irritable bowel syndrome with abdominal pain, constipation, diarrhea: 1. Start taking a fiber supplement 1-2 times a day 2. You can also start taking a probiotic 3. Call us if you have any worsening or severe symptoms  GERD (reflux/heartburn): 1. Glad your symptoms are doing better 2. Continue your current medications  Unexplained weight loss and persistent rectal bleeding: 1. Because of your symptoms I will order a CT of your abdomen and pelvis to further evaluate 2. Depending on your results further recommendations will follow 3. We may need to consider repeating your colonoscopy early given your ongoing symptoms 4. Call us if you have any severe or worsening symptoms  Overall I recommend:  1. Continue your current medications 2. Call us if you have any questions or concerns 3. Return for follow-up in 6 to 8 weeks.   Because of recent events of COVID-19 ("Coronavirus"), follow CDC recommendations:  1. Wash your hand frequently 2. Avoid touching your face 3. Stay away from people who are sick 4. If you have symptoms such as fever, cough, shortness of breath then call your healthcare provider for further guidance 5. If you are sick, STAY AT HOME unless otherwise directed by your healthcare provider. 6. Follow directions from state and national officials regarding staying safe   At [facility] we value your feedback. You may receive a survey about your visit today. Please share your experience as we strive to create trusting relationships with our patients to provide genuine, compassionate, quality care.  We appreciate your understanding and patience as we review any laboratory studies, imaging, and other diagnostic tests that are ordered as we care for you. Our office policy is 5 business days for review of these results, and any emergent or urgent results are addressed in a timely manner for your best interest. If you  do not hear from our office in 1 week, please contact us.   We also encourage the use of MyChart, which contains your medical information for your review as well. If you are not enrolled in this feature, an access code is on this after visit summary for your convenience. Thank you for allowing [place] to be involved in your care.  It was great to see you today!  I hope you have a great summer!!

## 2018-10-02 NOTE — Progress Notes (Signed)
Referring Provider: Redmond School, MD Primary Care Physician:  Redmond School, MD Primary GI:  Dr. Gala Romney  NOTE: Service was provided via telemedicine and was requested by the patient due to COVID-19 pandemic.  Method of visit: Doxy.Me   Patient Location: Home  Provider Location: Office  Reason for Phone Visit: Follow-up  The patient was consented to phone follow-up via telephone encounter including billing of the encounter (yes/no): Yes  Persons present on the phone encounter, with roles: Son  Total time (minutes) spent on medical discussion: 22 minutes  Chief Complaint  Patient presents with  . Follow-up    having bright red rectal bleeding  . Gastroesophageal Reflux    doing much better    HPI:   Sara Pruitt is a 43 y.o. female who presents for virtual visit regarding: Follow-up on GERD.  Patient was last seen in our office 07/23/2018.  History of chronic IBS.  Dicyclomine caused tachycardia.  Colonoscopy up-to-date 2018 and next due in 2023.  Did well on Viberzi for 2 weeks and then reverted to constipation.  Recommended decrease dosing to once daily.  Her last visit no further GERD.  Taking over-the-counter anti-gas medicine for flatulence.  Typically has a bowel movement once a week and sometimes twice a week on Viberzi once daily.  No diarrhea.  Some scant toilet tissue hematochezia with known hemorrhoids.  No hemorrhoid symptoms.  No other GI complaints.  Recommended Metamucil and/or MiraLAX as needed, Viberzi 1-2 times a day as needed for diarrhea, follow-up in 3 months.  Today she states she's doing well overall. GERD is doing a lot better, no breakthrough symptoms unless dietary indiscretion; not currently on a PPI or H2RB. Not on Viberzi currently and difficult to use prn due to rapidly variable stools ("I may go to the bathroom now and have loose stool and then later today have constipation.") Out of 10 stools she would expect 4 stools to be "almost  diarrhea", 3 would be normal, and 3 would be constipated. Still with rectal bleeding about 1-2 times a week, variable in amount from scant to moderate. Tends to be on the paper and at times in the toilet. No rectal discomfort or post-bm rectal fullness/bulging. Rectal bleeding not necessarily associated with constipated stools, occurs at random. Intermittent abdominal pain, typically occurs when she needs to have a bowel movement; described as "like menstrual cramps or contractions" and resolves after a bowel movement. Denies melena, fever, chills/ Has lost about 15-20 lbs over the past 2-3 months despite COVID-19 and home quarantine with "eating a lot." Denies URI or flu-like symptoms. Denies loss of sense of taste or smell. Denies chest pain, dyspnea, dizziness, lightheadedness, syncope, near syncope. Denies any other upper or lower GI symptoms.  Past Medical History:  Diagnosis Date  . Abnormal pap   . Chronic fatigue   . Constipation    ? IBS  . Fibroids    uterine  . GERD (gastroesophageal reflux disease)   . History of anemia   . Hypertension   . Idiopathic intracranial hypertension   . Migraines   . S/P colonoscopy June 07, 2010   Dr. Gala Romney: secondary to constipation, normal. 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 Sickle cell trait [facility])     Past Surgical History:  Procedure Laterality Date  . BILATERAL SALPINGECTOMY Bilateral 11/24/2013   Procedure: BILATERAL SALPINGECTOMY;  Surgeon: Florian Buff, MD;  Location: [facility];  Service: Gynecology;  Laterality: Bilateral;  . COLONOSCOPY N/A 04/10/2017   Normal.  next TCS in 5 years due to [place] of colon cancer  . 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 SUPRACERVICAL ABDOMINAL HYSTERECTOMY N/A 11/24/2013   Procedure: HYSTERECTOMY SUPRACERVICAL ABDOMINAL;  Surgeon: Florian Buff, MD;  Location: [facility];  Service: Gynecology;  Laterality: N/A;  . TOE SURGERY Bilateral    removal of partial bone of bilateral small toes    Current Outpatient  Medications  Medication Sig Dispense Refill  . aspirin-acetaminophen-caffeine (EXCEDRIN MIGRAINE) 250-250-65 MG tablet Take 2 tablets by mouth every 4 (four) hours as needed for headache.    . hydrochlorothiazide (HYDRODIURIL) 25 MG tablet Take 0.5 tablets by mouth daily.  1  . metoprolol tartrate (LOPRESSOR) 25 MG tablet Take 0.5 tablets by mouth daily.  10  . Multiple Vitamin (MULTIVITAMIN WITH MINERALS) TABS tablet Take 1 tablet by mouth daily.    Marland Kitchen TROKENDI XR 25 MG CP24 Take 1 capsule by mouth daily.     No current facility-administered medications for this visit.     Allergies as of 10/02/2018 - Review Complete 10/02/2018  Allergen Reaction Noted  . Strawberry extract Itching 11/18/2013  . Strawberry flavor  06/19/2017  . Strawberry (diagnostic) Rash 06/22/2017  . Sulfonamide derivatives Hives and Rash     Family History  Problem Relation Age of Onset  . Alcohol abuse Father   . Hypertension Father   . Hypertension Mother   . Fibroids Mother   . Colon cancer Mother        Age 33.  Marland Kitchen Hypertension Maternal Grandmother   . Fibroids Maternal Grandmother   . Hypertension Paternal Grandmother   . Hypertension Brother   . Stroke Maternal Uncle   . Celiac disease Neg Hx   . Inflammatory bowel disease Neg Hx     Social History   Socioeconomic History  . Marital status: Single    Spouse name: Not on file  . Number of children: Not on file  . Years of education: Not on file  . Highest education level: Not on file  Occupational History  . Not on file  Social Needs  . Financial resource strain: Not on file  . Food insecurity:    Worry: Not on file    Inability: Not on file  . Transportation needs:    Medical: Not on file    Non-medical: Not on file  Tobacco Use  . Smoking status: Never Smoker  . Smokeless tobacco: Never Used  Substance and Sexual Activity  . Alcohol use: No  . Drug use: No  . Sexual activity: Yes    Partners: Male    Birth control/protection:  Surgical  Lifestyle  . Physical activity:    Days per week: Not on file    Minutes per session: Not on file  . Stress: Not on file  Relationships  . Social connections:    Talks on phone: Not on file    Gets together: Not on file    Attends religious service: Not on file    Active member of club or organization: Not on file    Attends meetings of clubs or organizations: Not on file    Relationship status: Not on file  Other Topics Concern  . Not on file  Social History Narrative  . Not on file    Review of Systems: General: Negative for anorexia, weight loss, fever, chills, fatigue, weakness. ENT: Negative for hoarseness, difficulty swallowing. CV: Negative for chest pain, angina, palpitations, peripheral edema.  Respiratory: Negative  for dyspnea at rest, cough, sputum, wheezing.  GI: See history of present illness. MS: Negative for joint pain, low back pain.  Endo: Negative for unusual weight change.  Heme: Negative for bruising or bleeding.  Physical Exam: Note: limited exam due to virtual visit General:   Alert and oriented. Pleasant and cooperative. Well-nourished and well-developed.  Head:  Normocephalic and atraumatic. Eyes:  Without icterus, sclera clear and conjunctiva pink.  Ears:  Normal auditory acuity. Skin:  Intact without facial significant lesions or rashes. Neurologic:  Alert and oriented x4;  grossly normal neurologically. Psych:  Alert and cooperative. Normal mood and affect. Heme/Lymph/Immune: No excessive bruising noted.

## 2018-10-05 ENCOUNTER — Encounter: Payer: Self-pay | Admitting: Internal Medicine

## 2018-10-05 ENCOUNTER — Telehealth: Payer: Self-pay | Admitting: *Deleted

## 2018-10-05 NOTE — Telephone Encounter (Signed)
CT abd/pelvis scheduled for 6/23 at 4:00pm, arrival 3:45pm, npo 4 hrs prior, p/u oral contrast.  Called patient and she is aware of appt details. Letter sent to [facility] per pt request.  Checked [employer] website for PA and received message "NOTE: This [facility] member does not require prior authorization for OUTPATIENT Radiology through [place] or [place] [facility] at this time."

## 2018-10-05 NOTE — Progress Notes (Signed)
cc'd to pcp 

## 2018-10-14 NOTE — Telephone Encounter (Signed)
PA approved via [employer] website. Auth# [id number] dates 10/14/2018-04/11/2019

## 2018-10-20 ENCOUNTER — Other Ambulatory Visit: Payer: Self-pay

## 2018-10-20 ENCOUNTER — Ambulatory Visit (HOSPITAL_COMMUNITY)
Admission: RE | Admit: 2018-10-20 | Discharge: 2018-10-20 | Disposition: A | Discharge status: Home / Self Care | Payer: BC Managed Care – PPO | Source: Ambulatory Visit | Attending: Nurse Practitioner | Admitting: Nurse Practitioner

## 2018-10-20 DIAGNOSIS — R634 Abnormal weight loss: Secondary | ICD-10-CM | POA: Diagnosis present

## 2018-10-20 DIAGNOSIS — K219 Gastro-esophageal reflux disease without esophagitis: Secondary | ICD-10-CM | POA: Insufficient documentation

## 2018-10-20 DIAGNOSIS — K582 Mixed irritable bowel syndrome: Secondary | ICD-10-CM

## 2018-10-20 DIAGNOSIS — K625 Hemorrhage of anus and rectum: Secondary | ICD-10-CM | POA: Insufficient documentation

## 2018-10-20 LAB — POCT I-STAT CREATININE: Creatinine, Ser: 0.9 mg/dL (ref 0.44–1.00)

## 2018-10-20 MED ORDER — IOHEXOL 300 MG/ML  SOLN
100.0000 mL | Freq: Once | INTRAMUSCULAR | Status: AC | PRN
  Administered 2018-10-20: 18:00:00 100 mL via INTRAVENOUS

## 2018-11-04 ENCOUNTER — Other Ambulatory Visit: Payer: Self-pay

## 2018-11-04 ENCOUNTER — Encounter: Payer: Self-pay | Admitting: Obstetrics and Gynecology

## 2018-11-04 ENCOUNTER — Ambulatory Visit (INDEPENDENT_AMBULATORY_CARE_PROVIDER_SITE_OTHER): Payer: BC Managed Care – PPO | Admitting: Obstetrics and Gynecology

## 2018-11-04 VITALS — BP 141/91 | HR 57 | Ht 67.0 in | Wt 210.4 lb

## 2018-11-04 DIAGNOSIS — N898 Other specified noninflammatory disorders of vagina: Secondary | ICD-10-CM

## 2018-11-04 MED ORDER — METRONIDAZOLE 500 MG PO TABS: 500.0000 mg | ORAL_TABLET | Freq: Two times a day (BID) | ORAL | 1 refills | Status: AC

## 2018-11-04 NOTE — Progress Notes (Signed)
Patient ID: Sara Pruitt, female   DOB: Mar 18, 1976, 43 y.o.   MRN: [id number]    [facility] Visit  @DATE @            Patient name: Sara Pruitt MRN [id number]  Date of birth: Mar 29, 1976  CC & HPI:  Sara Pruitt is a 43 y.o. female presenting today for vaginal bleeding sp hysterectomy. Used to having some spotting once every 3 months, which normally last 2 or 3 days then goes away. Lasted longer than a week and had blood clots, last saw clots 2 days ago in the toilets. Clots described in size of "fibriods before hysterectomy".  Notices an "iodine" smell in vaginal area. Is sexually active was having some bleeding after intercourse but after using lubricant, bleeding stopped.   ROS:  ROS +vaginal bleeding with clots -fever  Pertinent History Reviewed:   Reviewed: Significant for hysterectomy Medical         Past Medical History:  Diagnosis Date   Abnormal pap    Chronic fatigue    Constipation    ? IBS   Fibroids    uterine   GERD (gastroesophageal reflux disease)    History of anemia    Hypertension    Idiopathic intracranial hypertension    Migraines    S/P colonoscopy June 07, 2010   Dr. Gala Romney: secondary to constipation, normal. 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 next TCS in 5 years due to [place] of colon cancer   LEEP     CANCEROUS CELLS ON CERVIX   SUPRACERVICAL ABDOMINAL HYSTERECTOMY N/A 11/24/2013   Procedure: HYSTERECTOMY SUPRACERVICAL ABDOMINAL;  Surgeon: Florian Buff, MD;  Location: [facility];  Service: Gynecology;  Laterality: N/A;   TOE SURGERY Bilateral    removal of partial bone of  bilateral small toes   Medications: Reviewed & Updated - see associated section                       Current Outpatient Medications:    hydrochlorothiazide (HYDRODIURIL) 25 MG tablet, Take 0.5 tablets by mouth daily., Disp: , Rfl: 1   metoprolol tartrate (LOPRESSOR) 25 MG tablet, Take 0.5 tablets by mouth daily., Disp: , Rfl: 10   Multiple Vitamin (MULTIVITAMIN WITH MINERALS) TABS tablet, Take 1 tablet by mouth daily., Disp: , Rfl:    TROKENDI XR 25 MG CP24, Take 1 capsule by mouth daily., Disp: , Rfl:    Social History: Reviewed -  reports that she has never smoked. She has never used smokeless tobacco.  Objective Findings:  Vitals: Blood pressure (!) 141/91, pulse (!) 57, height 5\' 7"  (1.702 m), weight 210 lb 6.4 oz (95.4 kg).  PHYSICAL EXAMINATION General appearance - alert, well appearing, and in no distress Mental status - alert, oriented to person, place, and time, normal mood, behavior, speech, dress, motor activity, and thought processes, affect appropriate to mood  PELVIC Vagina - link pink streaks in vaginal discharge Cervix - slight redness, friable with multip mobile cervix Uterus -  surgically absent [place] - white cells, pos trich GCCHL collected  Assessment & Plan:   A:  1. Vaginal bleeding sp hysterectomy 2. Trichomonas vaginitis  P:  1. Metronidazole 2. 3-4 week POC    By signing my name below, I, Samul Dada, attest that this documentation has been prepared under the direction and in the presence of Jonnie Kind, MD. Electronically Signed: [place]. 11/04/18. 11:05 AM.  I personally performed the services described in this documentation, which was SCRIBED in my presence. The recorded information has been reviewed and considered accurate. It has been edited as necessary during review. Jonnie Kind, MD

## 2018-11-04 NOTE — Patient Instructions (Signed)
Trichomoniasis Trichomoniasis is an STI (sexually transmitted infection) that can affect both women and men. In women, the outer area of the female genitalia (vulva) and the vagina are affected. In men, mainly the penis is affected, but the prostate and other reproductive organs can also be involved.  This condition can be treated with medicine. It often has no symptoms (is asymptomatic), especially in men. If not treated, trichomoniasis can last for months or years. What are the causes? This condition is caused by a parasite called Trichomonas vaginalis. Trichomoniasis most often spreads from person to person (is contagious) through sexual contact. What increases the risk? The following factors may make you more likely to develop this condition:  Having unprotected sex.  Having sex with a partner who has trichomoniasis.  Having multiple sexual partners.  Having had previous trichomoniasis infections or other STIs. What are the signs or symptoms? In women, symptoms of trichomoniasis include:  Abnormal vaginal discharge that is clear, white, gray, or yellow-green and foamy and has an unusual "fishy" odor.  Itching and irritation of the vagina and vulva.  Burning or pain during urination or sex.  Redness and swelling of the genitals. In men, symptoms of trichomoniasis include:  Penile discharge that may be foamy or contain pus.  Pain in the penis. This may happen only when urinating.  Itching or irritation inside the penis.  Burning after urination or ejaculation. How is this diagnosed? In women, this condition may be found during a routine Pap test or physical exam. It may be found in men during a routine physical exam. Your health care provider may do tests to help diagnose this infection, such as:  Urine tests (men and women).  The following in women: ? Testing the pH of the vagina. ? A vaginal swab test that checks for the Trichomonas vaginalis parasite. ? Testing vaginal  secretions. Your health care provider may test you for other STIs, including HIV (human immunodeficiency virus). How is this treated? This condition is treated with medicine taken by mouth (orally), such as metronidazole or tinidazole, to fight the infection. Your sexual partner(s) also need to be tested and treated.  If you are a woman and you plan to become pregnant or think you may be pregnant, tell your health care provider right away. Some medicines that are used to treat the infection should not be taken during pregnancy. Your health care provider may recommend over-the-counter medicines or creams to help relieve itching or irritation. You may be tested for infection again 3 months after treatment. Follow these instructions at home:  Take and use over-the-counter and prescription medicines, including creams, only as told by your health care provider.  Take your antibiotic medicine as told by your health care provider. Do not stop taking the antibiotic even if you start to feel better.  Do not have sex until 7-10 days after you finish your medicine, or until your health care provider approves. Ask your health care provider when you may start to have sex again.  (Women) Do not douche or wear tampons while you have the infection.  Discuss your infection with your sexual partner(s). Make sure that your partner gets tested and treated, if necessary.  Keep all follow-up visits as told by your health care provider. This is important. How is this prevented?   Use condoms every time you have sex. Using condoms correctly and consistently can help protect against STIs.  Avoid having multiple sexual partners.  Talk with your sexual partner about any   symptoms that either of you may have, as well as any history of STIs.  Get tested for STIs and STDs (sexually transmitted diseases) before you have sex. Ask your partner to do the same.  Do not have sexual contact if you have symptoms of  trichomoniasis or another STI. Contact a health care provider if:  You still have symptoms after you finish your medicine.  You develop pain in your abdomen.  You have pain when you urinate.  You have bleeding after sex.  You develop a rash.  You feel nauseous or you vomit.  You plan to become pregnant or think you may be pregnant. Summary  Trichomoniasis is an STI (sexually transmitted infection) that can affect both women and men.  This condition often has no symptoms (is asymptomatic), especially in men.  Without treatment, this condition can last for months or years.  You should not have sex until 7-10 days after you finish your medicine, or until your health care provider approves. Ask your health care provider when you may start to have sex again.  Discuss your infection with your sexual partner(s). Make sure that your partner gets tested and treated, if necessary. This information is not intended to replace advice given to you by your health care provider. Make sure you discuss any questions you have with your health care provider. Document Released: 10/09/2000 Document Revised: 01/27/2018 Document Reviewed: 01/27/2018 Elsevier Patient Education  2020 Elsevier Inc.  

## 2018-11-06 LAB — GC/CHLAMYDIA PROBE AMP
Chlamydia trachomatis, NAA: NEGATIVE
Neisseria Gonorrhoeae by PCR: NEGATIVE

## 2018-11-10 ENCOUNTER — Other Ambulatory Visit: Payer: Self-pay

## 2018-11-10 ENCOUNTER — Other Ambulatory Visit: Payer: BC Managed Care – PPO

## 2018-11-10 DIAGNOSIS — Z20822 Contact with and (suspected) exposure to covid-19: Secondary | ICD-10-CM

## 2018-11-15 LAB — NOVEL CORONAVIRUS, NAA: SARS-CoV-2, NAA: NOT DETECTED

## 2018-12-09 ENCOUNTER — Ambulatory Visit: Payer: BC Managed Care – PPO | Admitting: Obstetrics and Gynecology

## 2018-12-16 ENCOUNTER — Other Ambulatory Visit: Payer: Self-pay

## 2018-12-16 ENCOUNTER — Ambulatory Visit: Payer: BC Managed Care – PPO | Admitting: Obstetrics and Gynecology

## 2018-12-16 ENCOUNTER — Encounter: Payer: Self-pay | Admitting: Obstetrics and Gynecology

## 2018-12-16 VITALS — BP 116/77 | HR 51 | Ht 67.0 in | Wt 210.8 lb

## 2018-12-16 DIAGNOSIS — A599 Trichomoniasis, unspecified: Secondary | ICD-10-CM | POA: Diagnosis not present

## 2018-12-16 NOTE — Progress Notes (Signed)
Patient ID: TIEN AISPURO, female   DOB: 08-24-1975, 43 y.o.   MRN: [id number]    [facility] Visit  @DATE @            Patient name: Sara Pruitt MRN [id number]  Date of birth: January 29, 1976  CC & HPI:  Sara Pruitt is a 43 y.o. female presenting today for POC of trichomonas vaginitis. Gave medication to partner and they are no longer together.  ROS:  ROS -vaginal bleeding -trichomonas vaginitis  All systems are negative except as noted in the HPI and PMH.    Pertinent History Reviewed:   Reviewed Medical         Past Medical History:  Diagnosis Date  . Abnormal pap   . Chronic fatigue   . Constipation    ? IBS  . Fibroids    uterine  . GERD (gastroesophageal reflux disease)   . History of anemia   . Hypertension   . Idiopathic intracranial hypertension   . Migraines   . S/P colonoscopy June 07, 2010   Dr. Gala Pruitt: secondary to constipation, normal. 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 Sickle cell trait ([place])                               Surgical Hx:    Past Surgical History:  Procedure Laterality Date  . BILATERAL SALPINGECTOMY Bilateral 11/24/2013   Procedure: BILATERAL SALPINGECTOMY;  Surgeon: Sara Buff, MD;  Location: [facility];  Service: Gynecology;  Laterality: Bilateral;  . COLONOSCOPY N/A 04/10/2017   Normal. next TCS in 5 years due to [place] of colon cancer  . 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 SUPRACERVICAL ABDOMINAL HYSTERECTOMY N/A 11/24/2013   Procedure: HYSTERECTOMY SUPRACERVICAL ABDOMINAL;  Surgeon: Sara Buff, MD;  Location: [facility];  Service: Gynecology;  Laterality: N/A;  . TOE SURGERY Bilateral    removal of partial bone of bilateral small toes   Medications: Reviewed & Updated - see associated section                       Current Outpatient Medications:  .  hydrochlorothiazide (HYDRODIURIL) 25 MG tablet, Take 0.5 tablets by mouth daily., Disp: , Rfl: 1 .  metoprolol tartrate (LOPRESSOR) 25 MG tablet, Take 0.5  tablets by mouth daily., Disp: , Rfl: 10 .  Multiple Vitamin (MULTIVITAMIN WITH MINERALS) TABS tablet, Take 1 tablet by mouth daily., Disp: , Rfl:  .  TROKENDI XR 25 MG CP24, Take 1 capsule by mouth daily., Disp: , Rfl:    Social History: Reviewed -  reports that she has never smoked. She has never used smokeless tobacco.  Objective Findings:  Vitals: Blood pressure 116/77, pulse (!) 51, height 5\' 7"  (1.702 m), weight 210 lb 12.8 oz (95.6 kg).  PHYSICAL EXAMINATION General appearance - alert, well appearing, and in no distress Mental status - alert, oriented to person, place, and time, normal mood, behavior, speech, dress, motor activity, and thought processes, affect appropriate to mood   PELVIC Vagina - normal appearing secretions Cervix - nml  Uterus - surgically absent [place] - epithelial cells, neg trich, clue yeast   Assessment & Plan:   A:  1.  POC for trichomonas vaginitis.  No evidence of acute current infection  P:  1.  PRN    By signing my name below, I, Samul Dada, attest that this documentation has  been prepared under the direction and in the presence of Sara Kind, MD. Electronically Signed: [place]. 12/16/18. 8:47 AM.  I personally performed the services described in this documentation, which was SCRIBED in my presence. The recorded information has been reviewed and considered accurate. It has been edited as necessary during review. Sara Kind, MD

## 2018-12-24 ENCOUNTER — Ambulatory Visit: Payer: BC Managed Care – PPO | Admitting: Nurse Practitioner

## 2019-01-06 ENCOUNTER — Other Ambulatory Visit: Payer: Self-pay

## 2019-01-06 ENCOUNTER — Encounter: Payer: Self-pay | Admitting: Nurse Practitioner

## 2019-01-06 ENCOUNTER — Ambulatory Visit (INDEPENDENT_AMBULATORY_CARE_PROVIDER_SITE_OTHER): Payer: BC Managed Care – PPO | Admitting: Nurse Practitioner

## 2019-01-06 VITALS — BP 121/73 | HR 65 | Temp 97.8°F | Ht 67.0 in | Wt 209.6 lb

## 2019-01-06 DIAGNOSIS — R1310 Dysphagia, unspecified: Secondary | ICD-10-CM | POA: Diagnosis not present

## 2019-01-06 DIAGNOSIS — R1319 Other dysphagia: Secondary | ICD-10-CM

## 2019-01-06 DIAGNOSIS — K219 Gastro-esophageal reflux disease without esophagitis: Secondary | ICD-10-CM

## 2019-01-06 DIAGNOSIS — K582 Mixed irritable bowel syndrome: Secondary | ICD-10-CM | POA: Diagnosis not present

## 2019-01-06 NOTE — Assessment & Plan Note (Signed)
The patient has a history of very difficult to manage IBS mixed type.  Her stools fluctuate quite frequently.  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  At this point she is come to terms with her abdominal discomfort which generally resolves after a bowel movement.  She does take fiber currently.  I recommended she continue fiber.  She can use MiraLAX as needed for constipation.  She declines any further attempt for diarrhea management.  Continue to monitor, notify us of any worsening or severe symptoms.  Follow-up in 4 months.

## 2019-01-06 NOTE — Patient Instructions (Signed)
Your health issues we discussed today were:   Dysphagia (difficulty swallowing) and odynophagia (uncomfortable swallowing): 1. Because of the sensation like things are "getting stuck" in the middle of your chest we will plan for an upper endoscopy with possible dilation 2. Further recommendations will follow your procedure 3. Continue taking your current medications, specifically your acid blocker 4. Notify us of any worsening or severe symptoms  Irritable bowel syndrome: 1. Continue taking fiber. 2. If you have worsening constipation you can use MiraLAX 1-2 times a day as needed 3. Call us if you have any worsening or severe symptoms  GERD (reflux/heartburn): 1. Continue taking your acid blocker as you have been 2. Avoid foods that are known to cause a flareup of your symptoms 3. If you do have a temporary flareup you can use something such as Tums or Rolaids to help 4. Call us if you have any worsening or severe symptoms  Overall I recommend:  1. Continue your other current medications 2. Follow-up in 4 months 3. Call us if you have any questions or concerns   Because of recent events of COVID-19 ("Coronavirus"), follow CDC recommendations:  1. Wash your hand frequently 2. Avoid touching your face 3. Stay away from people who are sick 4. If you have symptoms such as fever, cough, shortness of breath then call your healthcare provider for further guidance 5. If you are sick, STAY AT HOME unless otherwise directed by your healthcare provider. 6. Follow directions from state and national officials regarding staying safe   At [facility] we value your feedback. You may receive a survey about your visit today. Please share your experience as we strive to create trusting relationships with our patients to provide genuine, compassionate, quality care.  We appreciate your understanding and patience as we review any laboratory studies, imaging, and other diagnostic tests  that are ordered as we care for you. Our office policy is 5 business days for review of these results, and any emergent or urgent results are addressed in a timely manner for your best interest. If you do not hear from our office in 1 week, please contact us.   We also encourage the use of MyChart, which contains your medical information for your review as well. If you are not enrolled in this feature, an access code is on this after visit summary for your convenience. Thank you for allowing [place] to be involved in your care.  It was great to see you today!  I hope you have a great Fall!!

## 2019-01-06 NOTE — Assessment & Plan Note (Signed)
The patient has, what sounds like, mid esophageal dysphagia and odynophagia.  She does have a history of chronic GERD symptoms, although these are generally well controlled currently.  Query element of dysphagia due to to chronic GERD and possible Schatzki's ring, stricture, web.  Much less likely esophageal carcinoma.  At this point to be on the safe side we will plan for an upper endoscopy to further evaluate her esophagus with possible dilation.  Further recommendations to follow.  Continue current medications.  Proceed with EGD +/- dilation with Dr. Gala Romney in near future: the risks, benefits, and alternatives have been discussed with the patient in detail. The patient states understanding and desires to proceed.  Patient is not on any anticoagulants, anxiolytics, chronic pain medications, antidepressants, antidiabetics, or iron supplements.  Conscious sedation should be adequate for her procedure as it was performed last year

## 2019-01-06 NOTE — Progress Notes (Signed)
Referring Provider: Redmond School, MD Primary Care Physician:  [facility] Primary GI:  Dr. Gala Romney  Chief Complaint  Patient presents with  . Gastroesophageal Reflux    feels like everything sits in chest after eating 3 bites of food, forces self to eat  . Abdominal Pain    has had pain for so long just ignores it now    HPI:   Sara Pruitt is a 43 y.o. female who presents for follow-up on GERD and abdominal pain.  The patient was last seen in our office 10/02/2018 for GERD, IBS with both constipation and diarrhea, rectal bleeding, weight loss.  The patient's last visit was a virtual office visit due to coronavirus/COVID-19 pandemic.  History of chronic IBS, dicyclomine caused tachycardia.  Colonoscopy up-to-date and next due in 2023.  Viberzi was effective for 2 weeks then reverted to constipation.  We attempted to decrease the dose to once daily.  Recommended Metamucil and/or MiraLAX as needed.  Her last visit GERD doing a lot better without breakthrough unless dietary indiscretion, not currently taking PPI or H2RB.  Not currently taking Viberzi because it is difficult to use as needed due to rapidly changing stools.  She noted out of 10 stools she would expect for to be "almost diarrhea", 3 would be normal, and frequently constipated.  Still with intermittent hematochezia 1-2 times a week variable in amount and typically on the paper although sometimes in the toilet.  No rectal discomfort or symptoms.  Hematochezia occurs at random.  Abdominal pain "like menstrual cramps or contractions" when she does have a bowel movement and resolves after a bowel movement.  15 to 20 pound weight loss over the past 2 to 3 months subjectively.  No other GI complaints.  Recommended fiber supplement 1-2 times a day, probiotic, continue PRN PPI/H2RB, CT of the abdomen due to weight loss, may need to consider repeat colonoscopy, follow-up in 6 to 8 weeks.  CT of the abdomen and  pelvis completed 10/20/2018 which found unremarkable imaging exam, no new or acute interval findings.  No findings to explain patient history of epigastric pain and weight loss.  Today she states she's doing ok overall. 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 She states she is having a fullness problem/food sitting in the center of her chest when she eats; happens with all foods. Has to force herself to eat. No early satiety. States its "a tight pain that you get in your chest when you overeat, even though I haven't. Query mid-esophageal dysphagia and/or odynophagia. No regurgitation. Has chronic GERD. Recently having breakthrough due to dietary indiscretions; otherwise GERD doing well on PPI. Still with frequently varying stools loose stool and constipation. Has history of IBS mixed. Has previously tried Bentyl but caused tachycardia. Viberzi difficult to use with variable stools. Still has lower abdominal crampy pain when she has to have a bowel movement and subsides when she does have a bowel movement. Denies hematochezia, melena, fever, chills, unintentional weight loss (weight has stabilized). Denies URI or flu-like symptoms. Denies loss of sense of taste or smell. Denies chest pain, dyspnea, dizziness, lightheadedness, syncope, near syncope. Denies any other upper or lower GI symptoms.  Past Medical History:  Diagnosis Date  . Abnormal pap   . Chronic fatigue   . Constipation    ? IBS  . Fibroids    uterine  . GERD (  gastroesophageal reflux disease)   . History of anemia   . Hypertension   . Idiopathic intracranial hypertension   . Migraines   . S/P colonoscopy June 07, 2010   Dr. Gala Romney: secondary to constipation, normal. 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 Sickle cell trait [facility])     Past Surgical History:  Procedure Laterality Date  . BILATERAL SALPINGECTOMY Bilateral 11/24/2013   Procedure: BILATERAL  SALPINGECTOMY;  Surgeon: Florian Buff, MD;  Location: [facility];  Service: Gynecology;  Laterality: Bilateral;  . COLONOSCOPY N/A 04/10/2017   Normal. next TCS in 5 years due to [place] of colon cancer  . 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 SUPRACERVICAL ABDOMINAL HYSTERECTOMY N/A 11/24/2013   Procedure: HYSTERECTOMY SUPRACERVICAL ABDOMINAL;  Surgeon: Florian Buff, MD;  Location: [facility];  Service: Gynecology;  Laterality: N/A;  . TOE SURGERY Bilateral    removal of partial bone of bilateral small toes    Current Outpatient Medications  Medication Sig Dispense Refill  . FIBER PO Take by mouth daily.    . hydrochlorothiazide (HYDRODIURIL) 25 MG tablet Take 0.5 tablets by mouth daily.  1  . metoprolol tartrate (LOPRESSOR) 25 MG tablet Take 0.5 tablets by mouth daily.  10  . Multiple Vitamin (MULTIVITAMIN WITH MINERALS) TABS tablet Take 1 tablet by mouth daily.    . Probiotic Product (PROBIOTIC DAILY PO) Take by mouth daily.    Marland Kitchen TROKENDI XR 25 MG CP24 Take 1 capsule by mouth daily.     No current facility-administered medications for this visit.     Allergies as of 01/06/2019 - Review Complete 01/06/2019  Allergen Reaction Noted  . Strawberry extract Itching 11/18/2013  . Strawberry flavor  06/19/2017  . Strawberry (diagnostic) Rash 06/22/2017  . Sulfonamide derivatives Hives and Rash     Family History  Problem Relation Age of Onset  . Alcohol abuse Father   . Hypertension Father   . Hypertension Mother   . Fibroids Mother   . Colon cancer Mother        Age 66.  Marland Kitchen Hypertension Maternal Grandmother   . Fibroids Maternal Grandmother   . Hypertension Paternal Grandmother   . Hypertension Brother   . Stroke Maternal Uncle   . Celiac disease Neg Hx   . Inflammatory bowel disease Neg Hx     Social History   Socioeconomic History  . Marital status: Single    Spouse name: Not on file  . Number of children: Not on file  . Years of education: Not on file  . Highest education  level: Not on file  Occupational History  . Not on file  Social Needs  . Financial resource strain: Not on file  . Food insecurity    Worry: Not on file    Inability: Not on file  . Transportation needs    Medical: Not on file    Non-medical: Not on file  Tobacco Use  . Smoking status: Never Smoker  . Smokeless tobacco: Never Used  Substance and Sexual Activity  . Alcohol use: No  . Drug use: No  . Sexual activity: Yes    Partners: Male    Birth control/protection: Surgical  Lifestyle  . Physical activity    Days per week: Not on file    Minutes per session: Not on file  . Stress: Not on file  Relationships  . Social Herbalist on phone: Not on file    Gets together: Not on  file    Attends religious service: Not on file    Active member of club or organization: Not on file    Attends meetings of clubs or organizations: Not on file    Relationship status: Not on file  Other Topics Concern  . Not on file  Social History Narrative  . Not on file    Review of Systems: General: Negative for anorexia, weight loss, fever, chills, fatigue, weakness. ENT: Negative for hoarseness. Some difficulty swallowing as per HPI. CV: Negative for chest pain, angina, palpitations, peripheral edema.  Respiratory: Negative for dyspnea at rest, cough, sputum, wheezing.  GI: See history of present illness. Endo: Negative for unusual weight change.  Heme: Negative for bruising or bleeding. Allergy: Negative for rash or hives.   Physical Exam: BP 121/73   Pulse 65   Temp 97.8 F (36.6 C) (Oral)   Ht 5\' 7"  (1.702 m)   Wt 209 lb 9.6 oz (95.1 kg)   BMI 32.83 kg/m  General:   Alert and oriented. Pleasant and cooperative. Well-nourished and well-developed.  Eyes:  Without icterus, sclera clear and conjunctiva pink.  Ears:  Normal auditory acuity. Cardiovascular:  S1, S2 present without murmurs appreciated. Extremities without clubbing or edema. Respiratory:  Clear to  auscultation bilaterally. No wheezes, rales, or rhonchi. No distress.  Gastrointestinal:  +BS, soft, and non-distended. Mild lower abdominal TTP. No HSM noted. No guarding or rebound. No masses appreciated.  Rectal:  Deferred  Musculoskalatal:  Symmetrical without gross deformities. Neurologic:  Alert and oriented x4;  grossly normal neurologically. Psych:  Alert and cooperative. Normal mood and affect. Heme/Lymph/Immune: No excessive bruising noted.    01/06/2019 4:18 PM   Disclaimer: This note was dictated with voice recognition software. Similar sounding words can inadvertently be transcribed and may not be corrected upon review.

## 2019-01-06 NOTE — Assessment & Plan Note (Signed)
History of chronic GERD.  Symptoms generally well managed currently on PPI.  Rare breakthrough generally only limited to dietary indiscretions.  Recommend she continue her current medications and follow-up in 4 months.

## 2019-01-07 ENCOUNTER — Encounter: Payer: Self-pay | Admitting: Internal Medicine

## 2019-01-11 ENCOUNTER — Other Ambulatory Visit: Payer: Self-pay

## 2019-01-11 ENCOUNTER — Telehealth: Payer: Self-pay | Admitting: Internal Medicine

## 2019-01-11 DIAGNOSIS — R1319 Other dysphagia: Secondary | ICD-10-CM

## 2019-01-11 DIAGNOSIS — R1310 Dysphagia, unspecified: Secondary | ICD-10-CM

## 2019-01-11 DIAGNOSIS — K219 Gastro-esophageal reflux disease without esophagitis: Secondary | ICD-10-CM

## 2019-01-11 NOTE — Telephone Encounter (Signed)
[phone] or [phone]  Patient called and said that she is ready to schedule her endo

## 2019-01-11 NOTE — Telephone Encounter (Signed)
Called pt, EGD/-/+DIL w/RMR scheduled for 03/02/19 at 2:00pm. COVID test 02/26/19 at 3:10pm per pt request. Pt aware to quarantine at home after test until procedure. Orders entered. Appt letter and procedure instructions mailed.

## 2019-02-26 ENCOUNTER — Other Ambulatory Visit: Payer: Self-pay

## 2019-02-26 ENCOUNTER — Other Ambulatory Visit (HOSPITAL_COMMUNITY)
Admission: RE | Admit: 2019-02-26 | Discharge: 2019-02-26 | Disposition: A | Discharge status: Home / Self Care | Payer: BC Managed Care – PPO | Source: Ambulatory Visit | Attending: Internal Medicine | Admitting: Internal Medicine

## 2019-02-26 DIAGNOSIS — Z01812 Encounter for preprocedural laboratory examination: Secondary | ICD-10-CM | POA: Diagnosis present

## 2019-02-26 DIAGNOSIS — Z20828 Contact with and (suspected) exposure to other viral communicable diseases: Secondary | ICD-10-CM | POA: Insufficient documentation

## 2019-02-26 LAB — SARS CORONAVIRUS 2 (TAT 6-24 HRS): SARS Coronavirus 2: NEGATIVE

## 2019-03-02 ENCOUNTER — Encounter (HOSPITAL_COMMUNITY)
Admission: RE | Disposition: A | Discharge status: Home / Self Care | Payer: Self-pay | Source: Home / Self Care | Attending: Internal Medicine

## 2019-03-02 ENCOUNTER — Ambulatory Visit (HOSPITAL_COMMUNITY)
Admission: RE | Admit: 2019-03-02 | Discharge: 2019-03-02 | Disposition: A | Discharge status: Home / Self Care | Payer: BC Managed Care – PPO | Attending: Internal Medicine | Admitting: Internal Medicine

## 2019-03-02 ENCOUNTER — Encounter (HOSPITAL_COMMUNITY): Payer: Self-pay | Admitting: *Deleted

## 2019-03-02 ENCOUNTER — Other Ambulatory Visit: Payer: Self-pay

## 2019-03-02 DIAGNOSIS — R1319 Other dysphagia: Secondary | ICD-10-CM

## 2019-03-02 DIAGNOSIS — G43909 Migraine, unspecified, not intractable, without status migrainosus: Secondary | ICD-10-CM | POA: Insufficient documentation

## 2019-03-02 DIAGNOSIS — K209 Esophagitis, unspecified without bleeding: Secondary | ICD-10-CM | POA: Diagnosis not present

## 2019-03-02 DIAGNOSIS — Z79899 Other long term (current) drug therapy: Secondary | ICD-10-CM | POA: Diagnosis not present

## 2019-03-02 DIAGNOSIS — K2100 Gastro-esophageal reflux disease with esophagitis, without bleeding: Secondary | ICD-10-CM | POA: Insufficient documentation

## 2019-03-02 DIAGNOSIS — R1310 Dysphagia, unspecified: Secondary | ICD-10-CM | POA: Diagnosis not present

## 2019-03-02 DIAGNOSIS — K219 Gastro-esophageal reflux disease without esophagitis: Secondary | ICD-10-CM

## 2019-03-02 DIAGNOSIS — D573 Sickle-cell trait: Secondary | ICD-10-CM | POA: Insufficient documentation

## 2019-03-02 DIAGNOSIS — R1314 Dysphagia, pharyngoesophageal phase: Secondary | ICD-10-CM | POA: Insufficient documentation

## 2019-03-02 DIAGNOSIS — I10 Essential (primary) hypertension: Secondary | ICD-10-CM | POA: Insufficient documentation

## 2019-03-02 HISTORY — PX: MALONEY DILATION: SHX5535

## 2019-03-02 HISTORY — PX: ESOPHAGOGASTRODUODENOSCOPY: SHX5428

## 2019-03-02 SURGERY — EGD (ESOPHAGOGASTRODUODENOSCOPY)
Anesthesia: Moderate Sedation

## 2019-03-02 MED ORDER — LIDOCAINE VISCOUS HCL 2 % MT SOLN
OROMUCOSAL | Status: AC
  Filled 2019-03-02: qty 15

## 2019-03-02 MED ORDER — ONDANSETRON HCL 4 MG/2ML IJ SOLN
INTRAMUSCULAR | Status: DC | PRN
  Administered 2019-03-02: 4 mg via INTRAVENOUS

## 2019-03-02 MED ORDER — ONDANSETRON HCL 4 MG/2ML IJ SOLN
INTRAMUSCULAR | Status: AC
  Filled 2019-03-02: qty 2

## 2019-03-02 MED ORDER — LIDOCAINE VISCOUS HCL 2 % MT SOLN
OROMUCOSAL | Status: DC | PRN
  Administered 2019-03-02: 1 via OROMUCOSAL

## 2019-03-02 MED ORDER — MEPERIDINE HCL 50 MG/ML IJ SOLN
INTRAMUSCULAR | Status: AC
  Filled 2019-03-02: qty 1

## 2019-03-02 MED ORDER — MIDAZOLAM HCL 5 MG/5ML IJ SOLN
INTRAMUSCULAR | Status: AC
  Filled 2019-03-02: qty 10

## 2019-03-02 MED ORDER — MIDAZOLAM HCL 5 MG/5ML IJ SOLN
INTRAMUSCULAR | Status: DC | PRN
  Administered 2019-03-02: 2 mg via INTRAVENOUS
  Administered 2019-03-02: 1 mg via INTRAVENOUS
  Administered 2019-03-02: 2 mg via INTRAVENOUS

## 2019-03-02 MED ORDER — MEPERIDINE HCL 100 MG/ML IJ SOLN
INTRAMUSCULAR | Status: DC | PRN
  Administered 2019-03-02: 40 mg via INTRAVENOUS

## 2019-03-02 MED ORDER — SODIUM CHLORIDE 0.9 % IV SOLN
INTRAVENOUS | Status: DC
  Administered 2019-03-02: 13:00:00 via INTRAVENOUS

## 2019-03-02 NOTE — Op Note (Signed)
[facility] Patient Name: Sara Pruitt Procedure Date: 03/02/2019 11:53 AM MRN: [id number] Date of Birth: May 27, 1975 Attending MD: Norvel Richards , MD CSN: [id number] Age: 43 Admit Type: Outpatient Procedure:                Upper GI endoscopy Indications:              Dysphagia, Suspected gastro-esophageal reflux                            disease Providers:                Norvel Richards, MD, Charlsie Quest. Theda Sers RN, RN,                            Aram Candela Referring MD:              Medicines:                Midazolam 5 mg IV, Meperidine 40 mg IV, Ondansetron                            4 mg IV Complications:            No immediate complications. Estimated Blood Loss:     Estimated blood loss: none. Procedure:                Pre-Anesthesia Assessment:                           - Prior to the procedure, a History and Physical                            was performed, and patient medications and                            allergies were reviewed. The patient's tolerance of                            previous anesthesia was also reviewed. The risks                            and benefits of the procedure and the sedation                            options and risks were discussed with the patient.                            All questions were answered, and informed consent                            was obtained. Prior Anticoagulants: The patient has                            taken no previous anticoagulant or antiplatelet                            agents.  ASA Grade Assessment: II - A patient with                            mild systemic disease. After reviewing the risks                            and benefits, the patient was deemed in                            satisfactory condition to undergo the procedure.                           After obtaining informed consent, the endoscope was                            passed under direct vision. Throughout the                           procedure, the patient's blood pressure, pulse, and                            oxygen saturations were monitored continuously. The                            [id number]-[id number]) scope was introduced through the                            mouth, and advanced to the second part of duodenum.                            The upper GI endoscopy was accomplished without                            difficulty. The patient tolerated the procedure                            well. Scope In: 12:58:34 PM Scope Out: 1:03:28 PM Total Procedure Duration: 0 hours 4 minutes 54 seconds  Findings:      Esophagitis was found. Couple of 3 mm erosion straddling the GE       junction. No Barrett's epithelium seen. No tumor. Tubular esophagus       appeared patent throughout its course.      The entire examined stomach was normal.      The duodenal bulb and second portion of the duodenum were normal. The       scope was withdrawn. Dilation was performed with a Maloney dilator with       mild resistance at 60 Fr. The dilation site was examined following       endoscope reinsertion and showed no change. Estimated blood loss: none. Impression:               -Mild erosive reflux esophagitis. Dilated.                           - Normal stomach.                           -  Normal duodenal bulb and second portion of the                            duodenum.                           - No specimens collected. Moderate Sedation:      Moderate (conscious) sedation was personally administered by an       anesthesia professional. The following parameters were monitored: oxygen       saturation, heart rate, blood pressure, and response to care. Total       physician intraservice time was 12 minutes. Recommendation:           - Patient has a contact number available for                            emergencies. The signs and symptoms of potential                            delayed complications were  discussed with the                            patient. Return to normal activities tomorrow.                            Written discharge instructions were provided to the                            patient.                           - Resume previous diet. Begin Protonix 40 mg daily                           - Continue present medications.                           - Return to my office in 3 months. Procedure Code(s):        --- Professional ---                           [phone], Esophagogastroduodenoscopy, flexible,                            transoral; diagnostic, including collection of                            specimen(s) by brushing or washing, when performed                            (separate procedure)                           [id number], Dilation of esophagus, by unguided sound or  bougie, single or multiple passes Diagnosis Code(s):        --- Professional ---                           K20.90, Esophagitis, unspecified without bleeding                           R13.10, Dysphagia, unspecified CPT copyright 2019 [employer]. All rights reserved. The codes documented in this report are preliminary and upon coder review may  be revised to meet current compliance requirements. [name]. Yousef Huge, MD Norvel Richards, MD 03/02/2019 1:30:15 PM This report has been signed electronically. Number of Addenda: 0

## 2019-03-02 NOTE — Discharge Instructions (Signed)
Gastroesophageal Reflux Disease, Adult Gastroesophageal reflux (GER) happens when acid from the stomach flows up into the tube that connects the mouth and the stomach (esophagus). Normally, food travels down the esophagus and stays in the stomach to be digested. However, when a person has GER, food and stomach acid sometimes move back up into the esophagus. If this becomes a more serious problem, the person may be diagnosed with a disease called gastroesophageal reflux disease (GERD). GERD occurs when the reflux:  Happens often.  Causes frequent or severe symptoms.  Causes problems such as damage to the esophagus. When stomach acid comes in contact with the esophagus, the acid may cause soreness (inflammation) in the esophagus. Over time, GERD may create small holes (ulcers) in the lining of the esophagus. What are the causes? This condition is caused by a problem with the muscle between the esophagus and the stomach (lower esophageal sphincter, or LES). Normally, the LES muscle closes after food passes through the esophagus to the stomach. When the LES is weakened or abnormal, it does not close properly, and that allows food and stomach acid to go back up into the esophagus. The LES can be weakened by certain dietary substances, medicines, and medical conditions, including:  Tobacco use.  Pregnancy.  Having a hiatal hernia.  Alcohol use.  Certain foods and beverages, such as coffee, chocolate, onions, and peppermint. What increases the risk? You are more likely to develop this condition if you:  Have an increased body weight.  Have a connective tissue disorder.  Use NSAID medicines. What are the signs or symptoms? Symptoms of this condition include:  Heartburn.  Difficult or painful swallowing.  The feeling of having a lump in the throat.  Abitter taste in the mouth.  Bad breath.  Having a large amount of saliva.  Having an upset or bloated  stomach.  Belching.  Chest pain. Different conditions can cause chest pain. Make sure you see your health care provider if you experience chest pain.  Shortness of breath or wheezing.  Ongoing (chronic) cough or a night-time cough.  Wearing away of tooth enamel.  Weight loss. How is this diagnosed? Your health care provider will take a medical history and perform a physical exam. To determine if you have mild or severe GERD, your health care provider may also monitor how you respond to treatment. You may also have tests, including:  A test to examine your stomach and esophagus with a small camera (endoscopy).  A test thatmeasures the acidity level in your esophagus.  A test thatmeasures how much pressure is on your esophagus.  A barium swallow or modified barium swallow test to show the shape, size, and functioning of your esophagus. How is this treated? The goal of treatment is to help relieve your symptoms and to prevent complications. Treatment for this condition may vary depending on how severe your symptoms are. Your health care provider may recommend:  Changes to your diet.  Medicine.  Surgery. Follow these instructions at home: Eating and drinking   Follow a diet as recommended by your health care provider. This may involve avoiding foods and drinks such as: ? Coffee and tea (with or without caffeine). ? Drinks that containalcohol. ? Energy drinks and sports drinks. ? Carbonated drinks or sodas. ? Chocolate and cocoa. ? Peppermint and mint flavorings. ? Garlic and onions. ? Horseradish. ? Spicy and acidic foods, including peppers, chili powder, curry powder, vinegar, hot sauces, and barbecue sauce. ? Citrus fruit juices and citrus  fruits, such as oranges, lemons, and limes. ? Tomato-based foods, such as red sauce, chili, salsa, and pizza with red sauce. ? Fried and fatty foods, such as donuts, french fries, potato chips, and high-fat dressings. ? High-fat  meats, such as hot dogs and fatty cuts of red and white meats, such as rib eye steak, sausage, ham, and bacon. ? High-fat dairy items, such as whole milk, butter, and cream cheese.  Eat small, frequent meals instead of large meals.  Avoid drinking large amounts of liquid with your meals.  Avoid eating meals during the 2-3 hours before bedtime.  Avoid lying down right after you eat.  Do not exercise right after you eat. Lifestyle   Do not use any products that contain nicotine or tobacco, such as cigarettes, e-cigarettes, and chewing tobacco. If you need help quitting, ask your health care provider.  Try to reduce your stress by using methods such as yoga or meditation. If you need help reducing stress, ask your health care provider.  If you are overweight, reduce your weight to an amount that is healthy for you. Ask your health care provider for guidance about a safe weight loss goal. General instructions  Pay attention to any changes in your symptoms.  Take over-the-counter and prescription medicines only as told by your health care provider. Do not take aspirin, ibuprofen, or other NSAIDs unless your health care provider told you to do so.  Wear loose-fitting clothing. Do not wear anything tight around your waist that causes pressure on your abdomen.  Raise (elevate) the head of your bed about 6 inches (15 cm).  Avoid bending over if this makes your symptoms worse.  Keep all follow-up visits as told by your health care provider. This is important. Contact a health care provider if:  You have: ? New symptoms. ? Unexplained weight loss. ? Difficulty swallowing or it hurts to swallow. ? Wheezing or a persistent cough. ? A hoarse voice.  Your symptoms do not improve with treatment. Get help right away if you:  Have pain in your arms, neck, jaw, teeth, or back.  Feel sweaty, dizzy, or light-headed.  Have chest pain or shortness of breath.  Vomit and your vomit looks  like blood or coffee grounds.  Faint.  Have stool that is bloody or black.  Cannot swallow, drink, or eat. Summary  Gastroesophageal reflux happens when acid from the stomach flows up into the esophagus. GERD is a disease in which the reflux happens often, causes frequent or severe symptoms, or causes problems such as damage to the esophagus.  Treatment for this condition may vary depending on how severe your symptoms are. Your health care provider may recommend diet and lifestyle changes, medicine, or surgery.  Contact a health care provider if you have new or worsening symptoms.  Take over-the-counter and prescription medicines only as told by your health care provider. Do not take aspirin, ibuprofen, or other NSAIDs unless your health care provider told you to do so.  Keep all follow-up visits as told by your health care provider. This is important. This information is not intended to replace advice given to you by your health care provider. Make sure you discuss any questions you have with your health care provider. Document Released: 01/23/2005 Document Revised: 10/22/2017 Document Reviewed: 10/22/2017 Elsevier Patient Education  [place]. EGD Discharge instructions Please read the instructions outlined below and refer to this sheet in the next few weeks. These discharge instructions provide you with general information  on caring for yourself after you leave the hospital. Your doctor may also give you specific instructions. While your treatment has been planned according to the most current medical practices available, unavoidable complications occasionally occur. If you have any problems or questions after discharge, please call your doctor. ACTIVITY  You may resume your regular activity but move at a slower pace for the next 24 hours.   Take frequent rest periods for the next 24 hours.   Walking will help expel (get rid of) the air and reduce the bloated feeling in your  abdomen.   No driving for 24 hours (because of the anesthesia (medicine) used during the test).   You may shower.   Do not sign any important legal documents or operate any machinery for 24 hours (because of the anesthesia used during the test).  NUTRITION  Drink plenty of fluids.   You may resume your normal diet.   Begin with a light meal and progress to your normal diet.   Avoid alcoholic beverages for 24 hours or as instructed by your caregiver.  MEDICATIONS  You may resume your normal medications unless your caregiver tells you otherwise.  WHAT YOU CAN EXPECT TODAY  You may experience abdominal discomfort such as a feeling of fullness or gas pains.  FOLLOW-UP  Your doctor will discuss the results of your test with you.  SEEK IMMEDIATE MEDICAL ATTENTION IF ANY OF THE FOLLOWING OCCUR:  Excessive nausea (feeling sick to your stomach) and/or vomiting.   Severe abdominal pain and distention (swelling).   Trouble swallowing.   Temperature over 101 F (37.8 C).   Rectal bleeding or vomiting of blood.   GERD information provided  Begin Protonix 40 mg daily  Office visit with [place] in 3 months  At patient's request, I discussed with Harrell Gave, son at [phone]

## 2019-03-02 NOTE — H&P (Signed)
@LOGO @   Primary Care Physician:  [facility] Primary Gastroenterologist:  Dr. Gala Romney  Pre-Procedure History & Physical: HPI:  Sara Pruitt is a 43 y.o. female here for further evaluation of GERD and esophageal dysphagia.  Currently, on no acid suppression therapy.  Past Medical History:  Diagnosis Date  . Abnormal pap   . Chronic fatigue   . Constipation    ? IBS  . Fibroids    uterine  . GERD (gastroesophageal reflux disease)   . History of anemia   . Hypertension   . Idiopathic intracranial hypertension   . Migraines   . S/P colonoscopy June 07, 2010   Dr. Gala Romney: secondary to constipation, normal. 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 Sickle cell trait [facility])     Past Surgical History:  Procedure Laterality Date  . BILATERAL SALPINGECTOMY Bilateral 11/24/2013   Procedure: BILATERAL SALPINGECTOMY;  Surgeon: Florian Buff, MD;  Location: [facility];  Service: Gynecology;  Laterality: Bilateral;  . COLONOSCOPY N/A 04/10/2017   Normal. next TCS in 5 years due to [place] of colon cancer  . 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 SUPRACERVICAL ABDOMINAL HYSTERECTOMY N/A 11/24/2013   Procedure: HYSTERECTOMY SUPRACERVICAL ABDOMINAL;  Surgeon: Florian Buff, MD;  Location: [facility];  Service: Gynecology;  Laterality: N/A;  . TOE SURGERY Bilateral    removal of partial bone of bilateral small toes    Prior to Admission medications   Medication Sig Start Date End Date Taking? Authorizing Provider  hydrochlorothiazide (HYDRODIURIL) 25 MG tablet Take 12.5 tablets by mouth daily.  10/02/17  Yes [provider]  metoprolol tartrate (LOPRESSOR) 25 MG tablet Take 12.5 mg by mouth daily.  10/08/17  Yes [provider]  Multiple Vitamin (MULTIVITAMIN WITH MINERALS) TABS tablet Take 1 tablet by mouth daily.   Yes [provider]  Probiotic Product (PROBIOTIC DAILY PO) Take by mouth daily.   Yes [provider]  psyllium ([employer] NATURAL FIBER  SUPPLEMENT) 58.6 % packet Take 1 packet by mouth daily at 12 noon.   Yes [provider]  TROKENDI XR 25 MG CP24 Take 25 mg by mouth every other day.  06/06/18  Yes [provider]    Allergies as of 01/11/2019 - Review Complete 01/06/2019  Allergen Reaction Noted  . Strawberry extract Itching 11/18/2013  . Strawberry flavor  06/19/2017  . Strawberry (diagnostic) Rash 06/22/2017  . Sulfonamide derivatives Hives and Rash     Family History  Problem Relation Age of Onset  . Alcohol abuse Father   . Hypertension Father   . Hypertension Mother   . Fibroids Mother   . Colon cancer Mother        Age 41.  [place] Kitchen Hypertension Maternal Grandmother   . Fibroids Maternal Grandmother   . Hypertension Paternal Grandmother   . Hypertension Brother   . Stroke Maternal Uncle   . Celiac disease Neg Hx   . Inflammatory bowel disease Neg Hx     Social History   Socioeconomic History  . Marital status: Single    Spouse name: Not on file  . Number of children: Not on file  . Years of education: Not on file  . Highest education level: Not on file  Occupational History  . Not on file  Social Needs  . Financial resource strain: Not on file  . Food insecurity    Worry: Not on file    Inability: Not on file  . Transportation needs  Medical: Not on file    Non-medical: Not on file  Tobacco Use  . Smoking status: Never Smoker  . Smokeless tobacco: Never Used  Substance and Sexual Activity  . Alcohol use: No  . Drug use: No  . Sexual activity: Yes    Partners: Male    Birth control/protection: Surgical  Lifestyle  . Physical activity    Days per week: Not on file    Minutes per session: Not on file  . Stress: Not on file  Relationships  . Social Herbalist on phone: Not on file    Gets together: Not on file    Attends religious service: Not on file    Active member of club or organization: Not on file    Attends meetings of clubs or organizations: Not on  file    Relationship status: Not on file  . Intimate partner violence    Fear of current or ex partner: Not on file    Emotionally abused: Not on file    Physically abused: Not on file    Forced sexual activity: Not on file  Other Topics Concern  . Not on file  Social History Narrative  . Not on file    Review of Systems: See HPI, otherwise negative ROS  Physical Exam: BP (!) 142/77   Pulse 74   Temp 98.9 F (37.2 C) (Oral)   Resp 20   SpO2 100%  General:   Alert,  Well-developed, well-nourished, pleasant and cooperative in NAD Neck:  Supple; no masses or thyromegaly. No significant cervical adenopathy. Lungs:  Clear throughout to auscultation.   No wheezes, crackles, or rhonchi. No acute distress. Heart:  Regular rate and rhythm; no murmurs, clicks, rubs,  or gallops. Abdomen: Non-distended, normal bowel sounds.  Soft and nontender without appreciable mass or hepatosplenomegaly.  Pulses:  Normal pulses noted. Extremities:  Without clubbing or edema.  Impression/Plan: 43 year old lady with longstanding GERD now esophageal dysphagia. EGD now being done to further evaluate and manage. The risks, benefits, limitations, alternatives and imponderables have been reviewed with the patient. Potential for esophageal dilation, biopsy, etc. have also been reviewed.  Questions have been answered. All parties agreeable.     Notice: This dictation was prepared with Dragon dictation along with smaller phrase technology. Any transcriptional errors that result from this process are unintentional and may not be corrected upon review.

## 2019-03-05 ENCOUNTER — Encounter (HOSPITAL_COMMUNITY): Payer: Self-pay | Admitting: Internal Medicine

## 2019-03-14 ENCOUNTER — Telehealth: Payer: BC Managed Care – PPO | Admitting: Physician Assistant

## 2019-03-14 DIAGNOSIS — Z20822 Contact with and (suspected) exposure to covid-19: Secondary | ICD-10-CM

## 2019-03-14 DIAGNOSIS — Z20828 Contact with and (suspected) exposure to other viral communicable diseases: Secondary | ICD-10-CM

## 2019-03-14 NOTE — Progress Notes (Signed)
E-Visit for Corona Virus Screening   Your current symptoms could be consistent with the coronavirus.  Many health care providers can now test patients at their office but not all are.  [place] [facility] has multiple testing sites. For information on our COVID testing locations and hours go to [email or web address]  Please quarantine yourself while awaiting your test results.  We are enrolling you in our MyChart Home Mont[facility] for COVID19 . Daily you will receive a questionnaire within the MyChart website. Our COVID 19 response team willl be monitoriing your responses daily.    COVID-19 is a respiratory illness with symptoms that are similar to the flu. Symptoms are typically mild to moderate, but there have been cases of severe illness and death due to the virus. The following symptoms may appear 2-14 days after exposure: . Fever . Cough . Shortness of breath or difficulty breathing . Chills . Repeated shaking with chills . Muscle pain . Headache . Sore throat . New loss of taste or smell . Fatigue . Congestion or runny nose . Nausea or vomiting . Diarrhea  It is vitally important that if you feel that you have an infection such as this virus or any other virus that you stay home and away from places where you may spread it to others.  You should self-quarantine for 14 days if you have symptoms that could potentially be coronavirus or have been in close contact a with a person diagnosed with COVID-19 within the last 2 weeks. You should avoid contact with people age 65 and older.   You should wear a mask or cloth face covering over your nose and mouth if you must be around other people or animals, including pets (even at home). Try to stay at least 6 feet away from other people. This will protect the people around you.  You may also take acetaminophen (Tylenol) as needed for fever.   Reduce your risk of any infection by using the same precautions used for avoiding the  common cold or flu:  . Wash your hands often with soap and warm water for at least 20 seconds.  If soap and water are not readily available, use an alcohol-based hand sanitizer with at least 60% alcohol.  . If coughing or sneezing, cover your mouth and nose by coughing or sneezing into the elbow areas of your shirt or coat, into a tissue or into your sleeve (not your hands). . Avoid shaking hands with others and consider head nods or verbal greetings only. . Avoid touching your eyes, nose, or mouth with unwashed hands.  . Avoid close contact with people who are sick. . Avoid places or events with large numbers of people in one location, like concerts or sporting events. . Carefully consider travel plans you have or are making. . If you are planning any travel outside or inside the [place], visit the CDC's Travelers' Health webpage for the latest health notices. . If you have some symptoms but not all symptoms, continue to monitor at home and seek medical attention if your symptoms worsen. . If you are having a medical emergency, call 911.  HOME CARE . Only take medications as instructed by your medical team. . Drink plenty of fluids and get plenty of rest. . A steam or ultrasonic humidifier can help if you have congestion.   GET HELP RIGHT AWAY IF YOU HAVE EMERGENCY WARNING SIGNS** FOR COVID-19. If you or someone is showing any of these signs seek emergency medical care immediately. Call   Call 911 or proceed to your closest emergency facility if: . You develop worsening high fever. . Trouble breathing . Bluish lips or face . Persistent pain or pressure in the chest . New confusion . Inability to wake or stay awake . You cough up blood. . Your symptoms become more severe  **This list is not all possible symptoms. Contact your medical provider for any symptoms that are sever or concerning to you.   MAKE SURE YOU   Understand these instructions.  Will watch your condition.  Will get help  right away if you are not doing well or get worse.  Your e-visit answers were reviewed by a board certified advanced clinical practitioner to complete your personal care plan.  Depending on the condition, your plan could have included both over the counter or prescription medications.  If there is a problem please reply once you have received a response from your provider.  Your safety is important to [place].  If you have drug allergies check your prescription carefully.    You can use MyChart to ask questions about today's visit, request a non-urgent call back, or ask for a work or school excuse for 24 hours related to this e-Visit. If it has been greater than 24 hours you will need to follow up with your provider, or enter a new e-Visit to address those concerns. You will get an e-mail in the next two days asking about your experience.  I hope that your e-visit has been valuable and will speed your recovery. Thank you for using e-visits.   2

## 2019-03-14 NOTE — Progress Notes (Signed)
I have spent 5 minutes in review of e-visit questionnaire, review and updating patient chart, medical decision making and response to patient.   Cornesha Radziewicz Cody Annalia Metzger, PA-C    

## 2019-03-16 ENCOUNTER — Encounter (HOSPITAL_COMMUNITY): Payer: Self-pay | Admitting: *Deleted

## 2019-03-16 ENCOUNTER — Other Ambulatory Visit: Payer: Self-pay

## 2019-03-16 ENCOUNTER — Other Ambulatory Visit: Payer: BC Managed Care – PPO | Admitting: Obstetrics & Gynecology

## 2019-03-16 ENCOUNTER — Emergency Department (HOSPITAL_COMMUNITY)
Admission: EM | Admit: 2019-03-16 | Discharge: 2019-03-16 | Disposition: A | Discharge status: Home / Self Care | Payer: BC Managed Care – PPO | Attending: Emergency Medicine | Admitting: Emergency Medicine

## 2019-03-16 ENCOUNTER — Emergency Department (HOSPITAL_COMMUNITY): Payer: BC Managed Care – PPO

## 2019-03-16 DIAGNOSIS — S299XXA Unspecified injury of thorax, initial encounter: Secondary | ICD-10-CM | POA: Diagnosis present

## 2019-03-16 DIAGNOSIS — Y999 Unspecified external cause status: Secondary | ICD-10-CM | POA: Diagnosis not present

## 2019-03-16 DIAGNOSIS — Y92410 Unspecified street and highway as the place of occurrence of the external cause: Secondary | ICD-10-CM | POA: Diagnosis not present

## 2019-03-16 DIAGNOSIS — M545 Low back pain, unspecified: Secondary | ICD-10-CM

## 2019-03-16 DIAGNOSIS — S239XXA Sprain of unspecified parts of thorax, initial encounter: Secondary | ICD-10-CM

## 2019-03-16 DIAGNOSIS — I10 Essential (primary) hypertension: Secondary | ICD-10-CM | POA: Diagnosis not present

## 2019-03-16 DIAGNOSIS — Y9389 Activity, other specified: Secondary | ICD-10-CM | POA: Insufficient documentation

## 2019-03-16 DIAGNOSIS — Z79899 Other long term (current) drug therapy: Secondary | ICD-10-CM | POA: Insufficient documentation

## 2019-03-16 MED ORDER — CYCLOBENZAPRINE HCL 10 MG PO TABS
10.0000 mg | ORAL_TABLET | Freq: Once | ORAL | Status: AC
  Administered 2019-03-16: 18:00:00 10 mg via ORAL
  Filled 2019-03-16: qty 1

## 2019-03-16 MED ORDER — CYCLOBENZAPRINE HCL 10 MG PO TABS: 10.0000 mg | ORAL_TABLET | Freq: Two times a day (BID) | ORAL | 0 refills | Status: AC | PRN

## 2019-03-16 MED ORDER — NAPROXEN 500 MG PO TABS: 500.0000 mg | ORAL_TABLET | Freq: Two times a day (BID) | ORAL | 0 refills | Status: DC

## 2019-03-16 MED ORDER — KETOROLAC TROMETHAMINE 60 MG/2ML IM SOLN
60.0000 mg | Freq: Once | INTRAMUSCULAR | Status: AC
  Administered 2019-03-16: 60 mg via INTRAMUSCULAR
  Filled 2019-03-16: qty 2
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
## 2019-03-16 NOTE — Discharge Instructions (Signed)
Thank you for allowing me to care for you today. Please return to the emergency department if you have new or worsening symptoms. Take your medications as instructed.  ° °
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
## 2019-03-18 ENCOUNTER — Other Ambulatory Visit: Payer: BC Managed Care – PPO | Admitting: Obstetrics & Gynecology

## 2019-04-08 ENCOUNTER — Other Ambulatory Visit: Payer: Self-pay

## 2019-04-08 ENCOUNTER — Ambulatory Visit (INDEPENDENT_AMBULATORY_CARE_PROVIDER_SITE_OTHER): Payer: BC Managed Care – PPO | Admitting: Obstetrics & Gynecology

## 2019-04-08 ENCOUNTER — Encounter: Payer: Self-pay | Admitting: Obstetrics & Gynecology

## 2019-04-08 VITALS — BP 118/79 | HR 69 | Ht 67.0 in | Wt 204.0 lb

## 2019-04-08 DIAGNOSIS — Z01419 Encounter for gynecological examination (general) (routine) without abnormal findings: Secondary | ICD-10-CM
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
## 2019-04-09 ENCOUNTER — Encounter (HOSPITAL_COMMUNITY): Payer: Self-pay

## 2019-04-09 ENCOUNTER — Other Ambulatory Visit: Payer: Self-pay

## 2019-04-09 ENCOUNTER — Emergency Department (HOSPITAL_COMMUNITY)
Admission: EM | Admit: 2019-04-09 | Discharge: 2019-04-09 | Disposition: A | Discharge status: Home / Self Care | Payer: BC Managed Care – PPO | Attending: Emergency Medicine | Admitting: Emergency Medicine

## 2019-04-09 DIAGNOSIS — M79604 Pain in right leg: Secondary | ICD-10-CM | POA: Insufficient documentation

## 2019-04-09 DIAGNOSIS — Z79899 Other long term (current) drug therapy: Secondary | ICD-10-CM | POA: Insufficient documentation

## 2019-04-09 DIAGNOSIS — I10 Essential (primary) hypertension: Secondary | ICD-10-CM | POA: Diagnosis not present

## 2019-04-09 MED ORDER — HYDROCODONE-ACETAMINOPHEN 5-325 MG PO TABS: 1.0000 | ORAL_TABLET | ORAL | 0 refills | Status: DC | PRN

## 2019-04-09 MED ORDER — DEXAMETHASONE SODIUM PHOSPHATE 10 MG/ML IJ SOLN
10.0000 mg | Freq: Once | INTRAMUSCULAR | Status: AC
  Administered 2019-04-09: 22:00:00 10 mg via INTRAMUSCULAR
  Filled 2019-04-09: qty 1
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
## 2019-04-09 NOTE — Discharge Instructions (Signed)
Take Norco as needed for severe pain Take Tylenol for mild-moderate pain Use a heating pad for sore or stiff muscles - use for 20 minutes several times a day Try gentle range of motion exercises Please follow up with your doctor

## 2019-05-11 ENCOUNTER — Ambulatory Visit: Payer: BC Managed Care – PPO | Admitting: Nurse Practitioner

## 2019-05-17 ENCOUNTER — Ambulatory Visit: Payer: BC Managed Care – PPO | Admitting: Nurse Practitioner

## 2019-05-17 ENCOUNTER — Other Ambulatory Visit (HOSPITAL_COMMUNITY): Payer: Self-pay | Admitting: Physician Assistant

## 2019-05-17 DIAGNOSIS — Z1231 Encounter for screening mammogram for malignant neoplasm of breast: Secondary | ICD-10-CM

## 2019-05-27 ENCOUNTER — Ambulatory Visit: Payer: BC Managed Care – PPO | Admitting: Nurse Practitioner

## 2019-05-27 ENCOUNTER — Other Ambulatory Visit: Payer: Self-pay

## 2019-05-27 ENCOUNTER — Encounter: Payer: Self-pay | Admitting: Nurse Practitioner

## 2019-05-27 VITALS — BP 124/81 | HR 55 | Temp 96.8°F | Ht 67.0 in | Wt 215.0 lb

## 2019-05-27 DIAGNOSIS — K582 Mixed irritable bowel syndrome: Secondary | ICD-10-CM | POA: Diagnosis not present

## 2019-05-27 DIAGNOSIS — K625 Hemorrhage of anus and rectum: Secondary | ICD-10-CM | POA: Diagnosis not present

## 2019-05-27 DIAGNOSIS — K219 Gastro-esophageal reflux disease without esophagitis: Secondary | ICD-10-CM

## 2019-05-27 MED ORDER — PANTOPRAZOLE SODIUM 40 MG PO TBEC: 40.0000 mg | DELAYED_RELEASE_TABLET | Freq: Two times a day (BID) | ORAL | 5 refills | Status: DC

## 2019-05-27 NOTE — Patient Instructions (Signed)
Your health issues we discussed today were:   GERD (heartburn/reflux): 1. I am sending a new prescription to your pharmacy to increase your Protonix to twice a day. 2. Take this first thing in the morning and 30 minutes for your last meal the day 3. Call us if you have any worsening or severe symptoms  Irritable bowel syndrome (mixed constipation and diarrhea) with occasional rectal bleeding: 1. As we discussed your IBS symptoms could be causing rectal irritation and subsequent bleeding 2. Continue to monitor for worsening bleeding and let us know if this occurs 3. We can make further decisions including possible topical medications like creams or suppositories 4. Your next colonoscopy is due in 2023  Overall I recommend:  1. Continue your other current medications 2. Return for follow-up in 6 months 3. Call us if you have any questions or concerns   ---------------------------------------------------------------  COVID-19 Vaccine Information can be found at: [email or web address] For questions related to vaccine distribution or appointments, please email [email or web address] or call [phone].   ---------------------------------------------------------------   At [facility] we value your feedback. You may receive a survey about your visit today. Please share your experience as we strive to create trusting relationships with our patients to provide genuine, compassionate, quality care.  We appreciate your understanding and patience as we review any laboratory studies, imaging, and other diagnostic tests that are ordered as we care for you. Our office policy is 5 business days for review of these results, and any emergent or urgent results are addressed in a timely manner for your best interest. If you do not hear from our office in 1 week, please contact us.   We also encourage the use of MyChart, which  contains your medical information for your review as well. If you are not enrolled in this feature, an access code is on this after visit summary for your convenience. Thank you for allowing [place] to be involved in your care.  It was great to see you today!  I hope you have a great day!!
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
## 2019-05-27 NOTE — Assessment & Plan Note (Signed)
Patient notes approximately 2 episodes of hematochezia in the past 2 to 3 months.  Family history of colorectal cancer in her mother who was diagnosed at age 42.  She has had a colonoscopy couple years ago in 2018 which found completely normal colon and likely benign anorectal source of intermittent hematochezia.  She is due for repeat colonoscopy in 2023.  Her bleeding is not excessive.  Recommend she continue to monitor and if any worsening or persistence we can try Anusol rectal cream or suppositories to see if this helps.  Call for any worsening symptoms including symptoms of anemia.  Follow-up in 6 months.

## 2019-05-27 NOTE — Assessment & Plan Note (Signed)
On Protonix 40 mg daily her GERD is improved but still persistent.  Given that she had some improvement on Protonix I will increase this to twice a day to see if we can get any better effect.  GERD diet information discussed and printed for the patient.  Call for any worsening or severe symptoms.  Follow-up in 6 months.

## 2019-05-27 NOTE — Assessment & Plan Note (Signed)
History of irritable bowel syndrome mixed type with alternating constipation and diarrhea which could be aggravating her rectal mucosa and causing the bleeding as discussed above.  Recommend she continue her current medications and monitor for worsening bleeding.  Follow-up in 6 months.  Call us for any worsening or severe symptoms.

## 2019-05-31 DIAGNOSIS — K219 Gastro-esophageal reflux disease without esophagitis: Secondary | ICD-10-CM | POA: Insufficient documentation

## 2019-05-31 DIAGNOSIS — Q019 Encephalocele, unspecified: Secondary | ICD-10-CM | POA: Insufficient documentation

## 2019-05-31 DIAGNOSIS — K589 Irritable bowel syndrome without diarrhea: Secondary | ICD-10-CM | POA: Insufficient documentation

## 2019-05-31 DIAGNOSIS — I10 Essential (primary) hypertension: Secondary | ICD-10-CM | POA: Insufficient documentation

## 2019-07-05 ENCOUNTER — Ambulatory Visit (HOSPITAL_COMMUNITY): Payer: BC Managed Care – PPO

## 2019-07-06 ENCOUNTER — Other Ambulatory Visit: Payer: Self-pay

## 2019-07-06 ENCOUNTER — Emergency Department (HOSPITAL_COMMUNITY): Payer: BC Managed Care – PPO

## 2019-07-06 ENCOUNTER — Observation Stay (HOSPITAL_COMMUNITY)
Admission: EM | Admit: 2019-07-06 | Discharge: 2019-07-07 | Disposition: A | Discharge status: Home / Self Care | Payer: BC Managed Care – PPO | Attending: Internal Medicine | Admitting: Internal Medicine

## 2019-07-06 ENCOUNTER — Encounter (HOSPITAL_COMMUNITY): Payer: Self-pay | Admitting: *Deleted

## 2019-07-06 ENCOUNTER — Observation Stay (HOSPITAL_COMMUNITY): Payer: BC Managed Care – PPO

## 2019-07-06 DIAGNOSIS — Z882 Allergy status to sulfonamides status: Secondary | ICD-10-CM | POA: Insufficient documentation

## 2019-07-06 DIAGNOSIS — Z91018 Allergy to other foods: Secondary | ICD-10-CM | POA: Diagnosis not present

## 2019-07-06 DIAGNOSIS — G932 Benign intracranial hypertension: Secondary | ICD-10-CM | POA: Diagnosis not present

## 2019-07-06 DIAGNOSIS — R079 Chest pain, unspecified: Secondary | ICD-10-CM

## 2019-07-06 DIAGNOSIS — Z20822 Contact with and (suspected) exposure to covid-19: Secondary | ICD-10-CM | POA: Diagnosis not present

## 2019-07-06 DIAGNOSIS — R590 Localized enlarged lymph nodes: Secondary | ICD-10-CM | POA: Insufficient documentation

## 2019-07-06 DIAGNOSIS — Z79899 Other long term (current) drug therapy: Secondary | ICD-10-CM | POA: Insufficient documentation

## 2019-07-06 DIAGNOSIS — R0789 Other chest pain: Principal | ICD-10-CM | POA: Insufficient documentation

## 2019-07-06 DIAGNOSIS — E876 Hypokalemia: Secondary | ICD-10-CM

## 2019-07-06 DIAGNOSIS — K219 Gastro-esophageal reflux disease without esophagitis: Secondary | ICD-10-CM | POA: Diagnosis not present

## 2019-07-06 DIAGNOSIS — I10 Essential (primary) hypertension: Secondary | ICD-10-CM | POA: Diagnosis not present

## 2019-07-06 DIAGNOSIS — R7989 Other specified abnormal findings of blood chemistry: Secondary | ICD-10-CM

## 2019-07-06 DIAGNOSIS — R791 Abnormal coagulation profile: Secondary | ICD-10-CM | POA: Diagnosis not present

## 2019-07-06 LAB — CBC
HCT: 38.4 % (ref 36.0–46.0)
Hemoglobin: 12.8 g/dL (ref 12.0–15.0)
MCH: 29.3 pg (ref 26.0–34.0)
MCHC: 33.3 g/dL (ref 30.0–36.0)
MCV: 87.9 fL (ref 80.0–100.0)
Platelets: 301 10*3/uL (ref 150–400)
RBC: 4.37 MIL/uL (ref 3.87–5.11)
RDW: 12.5 % (ref 11.5–15.5)
WBC: 8.5 10*3/uL (ref 4.0–10.5)
nRBC: 0 % (ref 0.0–0.2)

## 2019-07-06 LAB — BASIC METABOLIC PANEL
Anion gap: 11 (ref 5–15)
BUN: 8 mg/dL (ref 6–20)
CO2: 30 mmol/L (ref 22–32)
Calcium: 8.6 mg/dL — ABNORMAL LOW (ref 8.9–10.3)
Chloride: 97 mmol/L — ABNORMAL LOW (ref 98–111)
Creatinine, Ser: 1.13 mg/dL — ABNORMAL HIGH (ref 0.44–1.00)
GFR calc Af Amer: >60 mL/min (ref >60)
GFR calc non Af Amer: >60 mL/min (ref >60)
Glucose, Bld: 96 mg/dL (ref 70–99)
Potassium: 2.6 mmol/L — CL (ref 3.5–5.1)
Sodium: 138 mmol/L (ref 135–145)

## 2019-07-06 LAB — D-DIMER, QUANTITATIVE: D-Dimer, Quant: 1.99 ug/mL-FEU — ABNORMAL HIGH (ref 0.00–0.50)

## 2019-07-06 LAB — TROPONIN I (HIGH SENSITIVITY)
Troponin I (High Sensitivity): 19 ng/L — ABNORMAL HIGH (ref <18)
Troponin I (High Sensitivity): 26 ng/L — ABNORMAL HIGH (ref <18)
Troponin I (High Sensitivity): 33 ng/L — ABNORMAL HIGH (ref <18)

## 2019-07-06 LAB — MAGNESIUM: Magnesium: 2.1 mg/dL (ref 1.7–2.4)

## 2019-07-06 MED ORDER — ONDANSETRON HCL 4 MG/2ML IJ SOLN
4.0000 mg | Freq: Once | INTRAMUSCULAR | Status: AC
  Administered 2019-07-06: 4 mg via INTRAVENOUS
  Filled 2019-07-06: qty 2

## 2019-07-06 MED ORDER — IOHEXOL 350 MG/ML SOLN
100.0000 mL | Freq: Once | INTRAVENOUS | Status: AC | PRN
  Administered 2019-07-06: 100 mL via INTRAVENOUS

## 2019-07-06 MED ORDER — POTASSIUM CHLORIDE 10 MEQ/100ML IV SOLN
10.0000 meq | INTRAVENOUS | Status: AC
  Administered 2019-07-06 (×4): 10 meq via INTRAVENOUS
  Filled 2019-07-06 (×4): qty 100

## 2019-07-06 MED ORDER — POTASSIUM CHLORIDE CRYS ER 20 MEQ PO TBCR
40.0000 meq | EXTENDED_RELEASE_TABLET | Freq: Once | ORAL | Status: AC
  Administered 2019-07-06: 40 meq via ORAL
  Filled 2019-07-06: qty 2

## 2019-07-06 NOTE — ED Triage Notes (Signed)
C/o chest pain onset 6 pm last night, c/o sharp stabbing pain with movement

## 2019-07-06 NOTE — ED Notes (Signed)
Date and time results received: 07/06/19 1909 (use smartphrase ".now" to insert current time)  Test: Potassium Critical Value: 2.6  Name of Provider Notified: Long, MD  Orders Received? Or Actions Taken?:
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
## 2019-07-07 ENCOUNTER — Observation Stay (HOSPITAL_COMMUNITY): Payer: BC Managed Care – PPO

## 2019-07-07 ENCOUNTER — Encounter (HOSPITAL_COMMUNITY): Payer: Self-pay | Admitting: Internal Medicine

## 2019-07-07 ENCOUNTER — Observation Stay (HOSPITAL_BASED_OUTPATIENT_CLINIC_OR_DEPARTMENT_OTHER): Payer: BC Managed Care – PPO

## 2019-07-07 DIAGNOSIS — I10 Essential (primary) hypertension: Secondary | ICD-10-CM

## 2019-07-07 DIAGNOSIS — R079 Chest pain, unspecified: Secondary | ICD-10-CM

## 2019-07-07 DIAGNOSIS — I361 Nonrheumatic tricuspid (valve) insufficiency: Secondary | ICD-10-CM | POA: Diagnosis not present

## 2019-07-07 DIAGNOSIS — R0789 Other chest pain: Secondary | ICD-10-CM

## 2019-07-07 DIAGNOSIS — R7989 Other specified abnormal findings of blood chemistry: Secondary | ICD-10-CM | POA: Diagnosis not present

## 2019-07-07 DIAGNOSIS — R9439 Abnormal result of other cardiovascular function study: Secondary | ICD-10-CM

## 2019-07-07 DIAGNOSIS — E876 Hypokalemia: Secondary | ICD-10-CM

## 2019-07-07 DIAGNOSIS — I340 Nonrheumatic mitral (valve) insufficiency: Secondary | ICD-10-CM | POA: Diagnosis not present

## 2019-07-07 DIAGNOSIS — R791 Abnormal coagulation profile: Secondary | ICD-10-CM | POA: Diagnosis not present

## 2019-07-07 DIAGNOSIS — R590 Localized enlarged lymph nodes: Secondary | ICD-10-CM | POA: Diagnosis not present

## 2019-07-07 LAB — RAPID URINE DRUG SCREEN, HOSP PERFORMED
Amphetamines: NOT DETECTED
Barbiturates: NOT DETECTED
Benzodiazepines: NOT DETECTED
Cocaine: NOT DETECTED
Opiates: NOT DETECTED
Tetrahydrocannabinol: NOT DETECTED

## 2019-07-07 LAB — BASIC METABOLIC PANEL
Anion gap: 8 (ref 5–15)
BUN: 7 mg/dL (ref 6–20)
CO2: 27 mmol/L (ref 22–32)
Calcium: 7.9 mg/dL — ABNORMAL LOW (ref 8.9–10.3)
Chloride: 108 mmol/L (ref 98–111)
Creatinine, Ser: 0.95 mg/dL (ref 0.44–1.00)
GFR calc Af Amer: >60 mL/min (ref >60)
GFR calc non Af Amer: >60 mL/min (ref >60)
Glucose, Bld: 102 mg/dL — ABNORMAL HIGH (ref 70–99)
Potassium: 3.5 mmol/L (ref 3.5–5.1)
Sodium: 143 mmol/L (ref 135–145)

## 2019-07-07 LAB — ECHOCARDIOGRAM COMPLETE: Height: 67 in

## 2019-07-07 LAB — HIV ANTIBODY (ROUTINE TESTING W REFLEX): HIV Screen 4th Generation wRfx: NONREACTIVE

## 2019-07-07 LAB — SARS CORONAVIRUS 2 (TAT 6-24 HRS): SARS Coronavirus 2: NEGATIVE

## 2019-07-07 MED ORDER — ACETAMINOPHEN 325 MG PO TABS: 650.0000 mg | ORAL_TABLET | Freq: Four times a day (QID) | ORAL | Status: DC | PRN

## 2019-07-07 MED ORDER — METOPROLOL TARTRATE 25 MG PO TABS
12.5000 mg | ORAL_TABLET | Freq: Every day | ORAL | Status: DC
  Administered 2019-07-07: 08:00:00 12.5 mg via ORAL
  Filled 2019-07-07: qty 1

## 2019-07-07 MED ORDER — ENOXAPARIN SODIUM 40 MG/0.4ML ~~LOC~~ SOLN
40.0000 mg | SUBCUTANEOUS | Status: DC
  Administered 2019-07-07: 40 mg via SUBCUTANEOUS
  Filled 2019-07-07: qty 0.4

## 2019-07-07 MED ORDER — POLYETHYLENE GLYCOL 3350 17 G PO PACK: 17.0000 g | PACK | Freq: Every day | ORAL | Status: DC | PRN

## 2019-07-07 MED ORDER — OXYCODONE-ACETAMINOPHEN 5-325 MG PO TABS: 1.0000 | ORAL_TABLET | Freq: Three times a day (TID) | ORAL | Status: DC | PRN

## 2019-07-07 MED ORDER — POTASSIUM CHLORIDE IN NACL 20-0.9 MEQ/L-% IV SOLN: INTRAVENOUS | Status: AC

## 2019-07-07 MED ORDER — ONDANSETRON HCL 4 MG PO TABS: 4.0000 mg | ORAL_TABLET | Freq: Four times a day (QID) | ORAL | Status: DC | PRN

## 2019-07-07 MED ORDER — ONDANSETRON HCL 4 MG/2ML IJ SOLN: 4.0000 mg | Freq: Four times a day (QID) | INTRAMUSCULAR | Status: DC | PRN

## 2019-07-07 MED ORDER — ACETAMINOPHEN 650 MG RE SUPP: 650.0000 mg | Freq: Four times a day (QID) | RECTAL | Status: DC | PRN

## 2019-07-07 MED ORDER — TOPIRAMATE 25 MG PO TABS
25.0000 mg | ORAL_TABLET | Freq: Every evening | ORAL | Status: DC
  Administered 2019-07-07: 25 mg via ORAL
  Filled 2019-07-07: qty 1

## 2019-07-07 MED ORDER — POTASSIUM CHLORIDE CRYS ER 20 MEQ PO TBCR
40.0000 meq | EXTENDED_RELEASE_TABLET | Freq: Once | ORAL | Status: AC
  Administered 2019-07-07: 40 meq via ORAL
  Filled 2019-07-07: qty 2

## 2019-07-07 MED ORDER — PANTOPRAZOLE SODIUM 40 MG PO TBEC
40.0000 mg | DELAYED_RELEASE_TABLET | Freq: Two times a day (BID) | ORAL | Status: DC
  Administered 2019-07-07: 40 mg via ORAL
  Filled 2019-07-07: qty 1

## 2019-07-07 MED ORDER — POTASSIUM CHLORIDE CRYS ER 20 MEQ PO TBCR
20.0000 meq | EXTENDED_RELEASE_TABLET | Freq: Once | ORAL | Status: AC
  Administered 2019-07-07: 20 meq via ORAL
  Filled 2019-07-07: qty 1

## 2019-07-07 NOTE — Discharge Summary (Signed)
Physician Discharge Summary  Sara Pruitt [id number] DOB: May 17, 1975 DOA: 07/06/2019  PCP: [facility]  Admit date: 07/06/2019 Discharge date: 07/07/2019  Admitted From: Home Disposition:  Home  Recommendations for Outpatient Follow-up:  1. Follow up with PCP in 1-2 weeks 2. Please obtain BMP/CBC in one week    Discharge Condition: Stable CODE STATUS: FULL Diet recommendation: Heart Healthy    Brief/Interim Summary: 44 year old female with a history of IBS, hypertension, intracranial hypertension presenting with substernal chest pain that began at 6 PM on 07/05/2019.  The patient states that it has been off and on sharp in nature that is worse with taking a deep breath or some type of movement.  She denies any recent fevers, chills, coughing, hemoptysis, exertional chest discomfort.  She denies any new medications.  The patient states that she recently had her second dose of the [place] now COVID-19 vaccination.  The vaccination resulted in a left deltoid hematoma.  She also had some chills but denies any fevers.  She denies any nausea, vomiting, diarrhea, domino pain, dysuria.  Discharge Diagnoses:  Atypical chest pain -Suspect musculoskeletal related as it is mostly reproducible on examination with palpation -07/07/2019 CTA chest--negative PE, bibasilar atelectasis, nonspecific retrosternal soft tissue density without mediastinal lymphadenopathy -Troponins unremarkable -Personally reviewed EKG--sinus rhythm, nonspecific T wave change -Echo-EF 65-70%, no WMA, mild MR -cardiology saw patient and cleared pt for dc  Left axillary adenopathy -likely reactive to recent left arm reaction to COVID-19 vaccination -out pt followup  Elevated D-dimer -CTA chest negative  Hypokalemia -Repleted -Magnesium 2.1  Essential hypertension -Continue metoprolol tartrate and HCTZ  Benign intracranial hypertension -Continue Topamax  GERD -Continue  Protonix   Discharge Instructions   Allergies as of 07/07/2019      Reactions   Sulfa Antibiotics Hives, Rash   Strawberry Extract Itching   Tongue itching. Any strawberry flavoring    Strawberry Flavor    Strawberry (diagnostic) Rash   Sulfonamide Derivatives Hives, Rash      Medication List    TAKE these medications   [employer] Natural Fiber Supplement 58.6 % packet Generic drug: psyllium Take 1 packet by mouth daily at 12 noon.   hydrochlorothiazide 25 MG tablet Commonly known as: HYDRODIURIL Take 12.5 tablets by mouth daily.   metoprolol tartrate 25 MG tablet Commonly known as: LOPRESSOR Take 12.5 mg by mouth daily.   multivitamin with minerals Tabs tablet Take 1 tablet by mouth daily.   pantoprazole 40 MG tablet Commonly known as: Protonix Take 1 tablet (40 mg total) by mouth 2 (two) times daily before a meal.   PROBIOTIC DAILY PO Take 1 tablet by mouth daily.   Trokendi XR 25 MG Cp24 Generic drug: Topiramate ER Take 25 mg by mouth every evening.       Allergies  Allergen Reactions  . Sulfa Antibiotics Hives and Rash  . Strawberry Extract Itching    Tongue itching. Any strawberry flavoring   . Strawberry Flavor   . Strawberry (Diagnostic) Rash  . Sulfonamide Derivatives Hives and Rash    Consultations:  Cardiology     Procedures/Studies: CT ANGIO CHEST PE W OR WO CONTRAST  Result Date: 07/07/2019 CLINICAL DATA:  Chest wall pain with elevated D-dimer. EXAM: CT ANGIOGRAPHY CHEST WITH CONTRAST TECHNIQUE: Multidetector CT imaging of the chest was performed using the standard protocol during bolus administration of intravenous contrast. Multiplanar CT image reconstructions and MIPs were obtained to evaluate the vascular anatomy. CONTRAST:  139mL OMNIPAQUE IOHEXOL 350 MG/ML SOLN COMPARISON:  None. FINDINGS: Cardiovascular: Contrast injection is sufficient to demonstrate satisfactory opacification of the pulmonary arteries to the segmental level. There is no  pulmonary embolus. The main pulmonary artery is within normal limits for size. There is no CT evidence of acute right heart strain. The visualized aorta is normal. Heart size is normal, without pericardial effusion. Mediastinum/Nodes: --there is a retrosternal soft tissue density measuring approximately 1.9 x 1.1 cm there is no significant mediastinal adenopathy. --there is left axillary adenopathy. No significant right axillary adenopathy. --No supraclavicular lymphadenopathy. --Normal thyroid gland. --The esophagus is unremarkable Lungs/Pleura: There is some atelectasis at the lung bases. There is no pneumothorax or significant pleural effusion. The trachea is unremarkable. Upper Abdomen: No acute abnormality. Musculoskeletal: No chest wall abnormality. No acute or significant osseous findings. Review of the MIP images confirms the above findings. IMPRESSION: 1. No acute pulmonary embolism. 2. Left axillary adenopathy of unknown clinical significance. This can be seen in patients who have received a COVID-19 vaccination in the left upper extremity. If there is no identifiable cause for this adenopathy, outpatient follow-up is recommended to confirm resolution. 3. Small nonspecific retrosternal soft tissue density. This may represent residual thymic tissue or a mildly enlarged lymph node. There is no overlying fracture to suggest a retrosternal hematoma. Electronically Signed   By: Constance Holster M.D.   On: 07/07/2019 00:20   DG Chest Port 1 View  Result Date: 07/06/2019 CLINICAL DATA:  Chest pain. EXAM: PORTABLE CHEST 1 VIEW COMPARISON:  Radiograph 10297 FINDINGS: Lung volumes are low.The cardiomediastinal contours are normal. Pulmonary vasculature is normal. No consolidation, pleural effusion, or pneumothorax. No acute osseous abnormalities are seen. IMPRESSION: Low lung volumes without acute abnormality. Electronically Signed   By: Keith Rake M.D.   On: 07/06/2019 18:04   ECHOCARDIOGRAM  COMPLETE  Result Date: 07/07/2019    ECHOCARDIOGRAM REPORT   Patient Name:   Sara Pruitt Date of Exam: 07/07/2019 Medical Rec #:  [id number]            Height:       67.0 in Accession #:    [id number]           Weight:       215.0 lb Date of Birth:  Sep 25, 1975            BSA:          2.085 m Patient Age:    19 years             BP:           117/65 mmHg Patient Gender: F                    HR:           70 bpm. Exam Location:  [facility] Procedure: 2D Echo Indications:    Chest Pain 786.50 / R07.9  History:        Patient has no prior history of Echocardiogram examinations.                 Signs/Symptoms:Chest Pain; Risk Factors:Non-Smoker. GERD.  Sonographer:    Leavy Cella RDCS (AE) Referring Phys: [phone] Braedon Sjogren IMPRESSIONS  1. Left ventricular ejection fraction, by estimation, is 65 to 70%. The left ventricle has normal function. The left ventricle has no regional wall motion abnormalities. Left ventricular diastolic parameters were normal.  2. Right ventricular systolic function is normal. The right ventricular size is normal. There is normal pulmonary artery systolic pressure.  The estimated right ventricular systolic pressure is [id number] mmHg.  3. The mitral valve is grossly normal. Mild mitral valve regurgitation.  4. The aortic valve is tricuspid. Aortic valve regurgitation is not visualized.  5. The inferior vena cava is normal in size with greater than 50% respiratory variability, suggesting right atrial pressure of 3 mmHg. FINDINGS  Left Ventricle: Left ventricular ejection fraction, by estimation, is 65 to 70%. The left ventricle has normal function. The left ventricle has no regional wall motion abnormalities. The left ventricular internal cavity size was normal in size. There is  no left ventricular hypertrophy. Left ventricular diastolic parameters were normal. Right Ventricle: The right ventricular size is normal. No increase in right ventricular wall thickness. Right ventricular systolic  function is normal. There is normal pulmonary artery systolic pressure. The tricuspid regurgitant velocity is 2.28 m/s, and  with an assumed right atrial pressure of 3 mmHg, the estimated right ventricular systolic pressure is [id number] mmHg. Left Atrium: Left atrial size was normal in size. Right Atrium: Right atrial size was normal in size. Pericardium: There is no evidence of pericardial effusion. Mitral Valve: The mitral valve is grossly normal. Mild mitral valve regurgitation. Tricuspid Valve: The tricuspid valve is grossly normal. Tricuspid valve regurgitation is mild. Aortic Valve: The aortic valve is tricuspid. Aortic valve regurgitation is not visualized. Pulmonic Valve: The pulmonic valve was grossly normal. Pulmonic valve regurgitation is trivial. Aorta: The aortic root is normal in size and structure. Venous: The inferior vena cava is normal in size with greater than 50% respiratory variability, suggesting right atrial pressure of 3 mmHg. IAS/Shunts: No atrial level shunt detected by color flow Doppler.  LEFT VENTRICLE PLAX 2D LVIDd:         4.21 cm  Diastology LVIDs:         2.40 cm  LV e' lateral:   12.60 cm/s LV PW:         0.86 cm  LV E/e' lateral: 7.8 LV IVS:        0.86 cm  LV e' medial:    11.40 cm/s LVOT diam:     1.90 cm  LV E/e' medial:  8.6 LVOT Area:     2.84 cm  RIGHT VENTRICLE RV S prime:     13.20 cm/s TAPSE (M-mode): 3.0 cm LEFT ATRIUM             Index       RIGHT ATRIUM           Index LA diam:        3.20 cm 1.53 cm/m  RA Area:     10.10 cm LA Vol (A2C):   24.8 ml 11.89 ml/m RA Volume:   22.00 ml  10.55 ml/m LA Vol (A4C):   32.0 ml 15.35 ml/m LA Biplane Vol: 30.1 ml 14.43 ml/m   AORTA Ao Root diam: 2.40 cm MITRAL VALVE               TRICUSPID VALVE MV Area (PHT): 2.91 cm    TR Peak grad:   20.8 mmHg MV Decel Time: 261 msec    TR Vmax:        228.00 cm/s MV E velocity: 98.00 cm/s MV A velocity: 62.60 cm/s  SHUNTS MV E/A ratio:  1.57        Systemic Diam: 1.90 cm Rozann Lesches MD  Electronically signed by Rozann Lesches MD Signature Date/Time: 07/07/2019/11:03:27 AM    Final  Discharge Exam: Vitals:   07/07/19 0056 07/07/19 0500  BP: 126/78 117/65  Pulse: 72 70  Resp: 19 18  Temp: 99.1 F (37.3 C) 98.4 F (36.9 C)  SpO2: 100% 98%   Vitals:   07/07/19 0030 07/07/19 0056 07/07/19 0257 07/07/19 0500  BP: 131/77 126/78  117/65  Pulse: 80 72  70  Resp: (!) 26 19  18   Temp:  99.1 F (37.3 C)  98.4 F (36.9 C)  TempSrc:  Oral  Oral  SpO2: 96% 100%  98%  Height:   5\' 7"  (1.702 m)     General: Pt is alert, awake, not in acute distress Cardiovascular: RRR, S1/S2 +, no rubs, no gallops Respiratory: CTA bilaterally, no wheezing, no rhonchi Abdominal: Soft, NT, ND, bowel sounds + Extremities: no edema, no cyanosis   The results of significant diagnostics from this hospitalization (including imaging, microbiology, ancillary and laboratory) are listed below for reference.    Significant Diagnostic Studies: CT ANGIO CHEST PE W OR WO CONTRAST  Result Date: 07/07/2019 CLINICAL DATA:  Chest wall pain with elevated D-dimer. EXAM: CT ANGIOGRAPHY CHEST WITH CONTRAST TECHNIQUE: Multidetector CT imaging of the chest was performed using the standard protocol during bolus administration of intravenous contrast. Multiplanar CT image reconstructions and MIPs were obtained to evaluate the vascular anatomy. CONTRAST:  154mL OMNIPAQUE IOHEXOL 350 MG/ML SOLN COMPARISON:  None. FINDINGS: Cardiovascular: Contrast injection is sufficient to demonstrate satisfactory opacification of the pulmonary arteries to the segmental level. There is no pulmonary embolus. The main pulmonary artery is within normal limits for size. There is no CT evidence of acute right heart strain. The visualized aorta is normal. Heart size is normal, without pericardial effusion. Mediastinum/Nodes: --there is a retrosternal soft tissue density measuring approximately 1.9 x 1.1 cm there is no significant  mediastinal adenopathy. --there is left axillary adenopathy. No significant right axillary adenopathy. --No supraclavicular lymphadenopathy. --Normal thyroid gland. --The esophagus is unremarkable Lungs/Pleura: There is some atelectasis at the lung bases. There is no pneumothorax or significant pleural effusion. The trachea is unremarkable. Upper Abdomen: No acute abnormality. Musculoskeletal: No chest wall abnormality. No acute or significant osseous findings. Review of the MIP images confirms the above findings. IMPRESSION: 1. No acute pulmonary embolism. 2. Left axillary adenopathy of unknown clinical significance. This can be seen in patients who have received a COVID-19 vaccination in the left upper extremity. If there is no identifiable cause for this adenopathy, outpatient follow-up is recommended to confirm resolution. 3. Small nonspecific retrosternal soft tissue density. This may represent residual thymic tissue or a mildly enlarged lymph node. There is no overlying fracture to suggest a retrosternal hematoma. Electronically Signed   By: Constance Holster M.D.   On: 07/07/2019 00:20   DG Chest Port 1 View  Result Date: 07/06/2019 CLINICAL DATA:  Chest pain. EXAM: PORTABLE CHEST 1 VIEW COMPARISON:  Radiograph 10297 FINDINGS: Lung volumes are low.The cardiomediastinal contours are normal. Pulmonary vasculature is normal. No consolidation, pleural effusion, or pneumothorax. No acute osseous abnormalities are seen. IMPRESSION: Low lung volumes without acute abnormality. Electronically Signed   By: Keith Rake M.D.   On: 07/06/2019 18:04   ECHOCARDIOGRAM COMPLETE  Result Date: 07/07/2019    ECHOCARDIOGRAM REPORT   Patient Name:   Sara Pruitt Date of Exam: 07/07/2019 Medical Rec #:  [id number]            Height:       67.0 in Accession #:    [id number]  Weight:       215.0 lb Date of Birth:  07-Jun-1975            BSA:          2.085 m Patient Age:    43 years             BP:            117/65 mmHg Patient Gender: F                    HR:           70 bpm. Exam Location:  [facility] Procedure: 2D Echo Indications:    Chest Pain 786.50 / R07.9  History:        Patient has no prior history of Echocardiogram examinations.                 Signs/Symptoms:Chest Pain; Risk Factors:Non-Smoker. GERD.  Sonographer:    Leavy Cella RDCS (AE) Referring Phys: [phone] Glenn Gullickson IMPRESSIONS  1. Left ventricular ejection fraction, by estimation, is 65 to 70%. The left ventricle has normal function. The left ventricle has no regional wall motion abnormalities. Left ventricular diastolic parameters were normal.  2. Right ventricular systolic function is normal. The right ventricular size is normal. There is normal pulmonary artery systolic pressure. The estimated right ventricular systolic pressure is [id number] mmHg.  3. The mitral valve is grossly normal. Mild mitral valve regurgitation.  4. The aortic valve is tricuspid. Aortic valve regurgitation is not visualized.  5. The inferior vena cava is normal in size with greater than 50% respiratory variability, suggesting right atrial pressure of 3 mmHg. FINDINGS  Left Ventricle: Left ventricular ejection fraction, by estimation, is 65 to 70%. The left ventricle has normal function. The left ventricle has no regional wall motion abnormalities. The left ventricular internal cavity size was normal in size. There is  no left ventricular hypertrophy. Left ventricular diastolic parameters were normal. Right Ventricle: The right ventricular size is normal. No increase in right ventricular wall thickness. Right ventricular systolic function is normal. There is normal pulmonary artery systolic pressure. The tricuspid regurgitant velocity is 2.28 m/s, and  with an assumed right atrial pressure of 3 mmHg, the estimated right ventricular systolic pressure is [id number] mmHg. Left Atrium: Left atrial size was normal in size. Right Atrium: Right atrial size was normal in size. Pericardium:  There is no evidence of pericardial effusion. Mitral Valve: The mitral valve is grossly normal. Mild mitral valve regurgitation. Tricuspid Valve: The tricuspid valve is grossly normal. Tricuspid valve regurgitation is mild. Aortic Valve: The aortic valve is tricuspid. Aortic valve regurgitation is not visualized. Pulmonic Valve: The pulmonic valve was grossly normal. Pulmonic valve regurgitation is trivial. Aorta: The aortic root is normal in size and structure. Venous: The inferior vena cava is normal in size with greater than 50% respiratory variability, suggesting right atrial pressure of 3 mmHg. IAS/Shunts: No atrial level shunt detected by color flow Doppler.  LEFT VENTRICLE PLAX 2D LVIDd:         4.21 cm  Diastology LVIDs:         2.40 cm  LV e' lateral:   12.60 cm/s LV PW:         0.86 cm  LV E/e' lateral: 7.8 LV IVS:        0.86 cm  LV e' medial:    11.40 cm/s LVOT diam:     1.90 cm  LV E/e' medial:  8.6 LVOT Area:     2.84 cm  RIGHT VENTRICLE RV S prime:     13.20 cm/s TAPSE (M-mode): 3.0 cm LEFT ATRIUM             Index       RIGHT ATRIUM           Index LA diam:        3.20 cm 1.53 cm/m  RA Area:     10.10 cm LA Vol (A2C):   24.8 ml 11.89 ml/m RA Volume:   22.00 ml  10.55 ml/m LA Vol (A4C):   32.0 ml 15.35 ml/m LA Biplane Vol: 30.1 ml 14.43 ml/m   AORTA Ao Root diam: 2.40 cm MITRAL VALVE               TRICUSPID VALVE MV Area (PHT): 2.91 cm    TR Peak grad:   20.8 mmHg MV Decel Time: 261 msec    TR Vmax:        228.00 cm/s MV E velocity: 98.00 cm/s MV A velocity: 62.60 cm/s  SHUNTS MV E/A ratio:  1.57        Systemic Diam: 1.90 cm Rozann Lesches MD Electronically signed by Rozann Lesches MD Signature Date/Time: 07/07/2019/11:03:27 AM    Final      Microbiology: No results found for this or any previous visit (from the past 240 hour(s)).   Labs: Basic Metabolic Panel: Recent Labs  Lab 07/06/19 1755 07/06/19 1941 07/07/19 0504  NA 138  --  143  K 2.6*  --  3.5  CL 97*  --  108  CO2  30  --  27  GLUCOSE 96  --  102*  BUN 8  --  7  CREATININE 1.13*  --  0.95  CALCIUM 8.6*  --  7.9*  MG  --  2.1  --    Liver Function Tests: No results for input(s): AST, ALT, ALKPHOS, BILITOT, PROT, ALBUMIN in the last 168 hours. No results for input(s): LIPASE, AMYLASE in the last 168 hours. No results for input(s): AMMONIA in the last 168 hours. CBC: Recent Labs  Lab 07/06/19 1755  WBC 8.5  HGB 12.8  HCT 38.4  MCV 87.9  PLT 301   Cardiac Enzymes: No results for input(s): CKTOTAL, CKMB, CKMBINDEX, TROPONINI in the last 168 hours. BNP: Invalid input(s): POCBNP CBG: No results for input(s): GLUCAP in the last 168 hours.  Time coordinating discharge:  36 minutes  Signed:  Orson Eva, DO [facility] Pager: [phone] 07/07/2019, 11:55 AM

## 2019-07-07 NOTE — Progress Notes (Signed)
*  PRELIMINARY RESULTS* Echocardiogram 2D Echocardiogram has been performed.  Sara Pruitt 07/07/2019, 9:28 AM
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
## 2019-08-10 DIAGNOSIS — M5416 Radiculopathy, lumbar region: Secondary | ICD-10-CM | POA: Insufficient documentation

## 2019-11-04 DIAGNOSIS — M25571 Pain in right ankle and joints of right foot: Secondary | ICD-10-CM | POA: Insufficient documentation

## 2019-11-04 DIAGNOSIS — M25671 Stiffness of right ankle, not elsewhere classified: Secondary | ICD-10-CM | POA: Insufficient documentation

## 2019-11-23 ENCOUNTER — Other Ambulatory Visit: Payer: Self-pay

## 2019-11-23 ENCOUNTER — Ambulatory Visit: Payer: BC Managed Care – PPO | Admitting: Nurse Practitioner

## 2019-11-23 ENCOUNTER — Encounter: Payer: Self-pay | Admitting: Nurse Practitioner

## 2019-11-23 VITALS — BP 117/76 | HR 66 | Temp 99.4°F | Ht 67.0 in | Wt 216.4 lb

## 2019-11-23 DIAGNOSIS — K582 Mixed irritable bowel syndrome: Secondary | ICD-10-CM | POA: Diagnosis not present

## 2019-11-23 DIAGNOSIS — K219 Gastro-esophageal reflux disease without esophagitis: Secondary | ICD-10-CM
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
## 2019-11-23 NOTE — Assessment & Plan Note (Signed)
She was previously doing quite well on Protonix twice daily.  Her symptoms have also been flared with specifically some increased gas and belching.  She again attributes this to increased stress.  Recommend she continue her current medications, use Tums over-the-counter as needed, call us in 4 weeks and let us know if she is doing any better with her significant improvement in stress.  Otherwise follow-up in 2 months.

## 2019-11-23 NOTE — Patient Instructions (Addendum)
Your health issues we discussed today were:   Irritable bowel syndrome: 1. Continue your current medications 2. Call me in 4 weeks and let me know if you are still not having any improvement 3. Call if you have any worsening or severe symptoms  GERD (reflux/heartburn): 1. Continue your current medications 2. Call me in 4 weeks if you are not having any improvement 3. Call if you have any worsening or severe symptoms  Overall I recommend:  1. I am sorry you are having a lot of stress currently!  Hopefully this improves soon 2. Continue your other current medications 3. Return for follow-up in 2 months 4. Call us if you have any questions or concerns 5. Remember: You are a [employer]!!!!   ---------------------------------------------------------------  I am glad you have gotten your COVID-19 vaccination!  Even though you are fully vaccinated you should continue to follow CDC and state/local guidelines.  ---------------------------------------------------------------   At [facility] we value your feedback. You may receive a survey about your visit today. Please share your experience as we strive to create trusting relationships with our patients to provide genuine, compassionate, quality care.  We appreciate your understanding and patience as we review any laboratory studies, imaging, and other diagnostic tests that are ordered as we care for you. Our office policy is 5 business days for review of these results, and any emergent or urgent results are addressed in a timely manner for your best interest. If you do not hear from our office in 1 week, please contact us.   We also encourage the use of MyChart, which contains your medical information for your review as well. If you are not enrolled in this feature, an access code is on this after visit summary for your convenience. Thank you for allowing [place] to be involved in your care.  It was great to see you today!  I  hope you have a great Summer!!

## 2019-11-23 NOTE — Assessment & Plan Note (Signed)
Some flare in her IBS symptoms that she feels is due to work stress that should be improving in the next 1 to 2 weeks.  She is mostly having loose stools at this time.  Her symptoms not overly bothersome as she typically only has 1 bowel movement a day, pursing in the morning, only on weekdays.  At this point we agreed to stay the course and continue her current medications.  She is to call me in 4 weeks and let me know if she is having any improvement when her stress level improves.  If not, we can recommend further options.  Follow-up in 2 months.

## 2019-11-24 ENCOUNTER — Encounter: Payer: Self-pay | Admitting: Internal Medicine

## 2019-11-25 DIAGNOSIS — S86011A Strain of right Achilles tendon, initial encounter: Secondary | ICD-10-CM | POA: Insufficient documentation

## 2019-12-01 ENCOUNTER — Other Ambulatory Visit: Payer: Self-pay | Admitting: Nurse Practitioner

## 2019-12-01 DIAGNOSIS — K582 Mixed irritable bowel syndrome: Secondary | ICD-10-CM

## 2019-12-01 DIAGNOSIS — K625 Hemorrhage of anus and rectum: Secondary | ICD-10-CM

## 2019-12-01 DIAGNOSIS — K219 Gastro-esophageal reflux disease without esophagitis: Secondary | ICD-10-CM

## 2020-01-25 ENCOUNTER — Encounter: Payer: Self-pay | Admitting: Internal Medicine

## 2020-01-25 ENCOUNTER — Ambulatory Visit: Payer: BC Managed Care – PPO | Admitting: Nurse Practitioner

## 2020-03-27 ENCOUNTER — Other Ambulatory Visit: Payer: Self-pay | Admitting: Neurology

## 2020-03-27 DIAGNOSIS — G932 Benign intracranial hypertension: Secondary | ICD-10-CM

## 2020-04-07 ENCOUNTER — Other Ambulatory Visit: Payer: Self-pay | Admitting: Neurology

## 2020-04-07 ENCOUNTER — Ambulatory Visit (HOSPITAL_COMMUNITY)
Admission: RE | Admit: 2020-04-07 | Discharge: 2020-04-07 | Disposition: A | Discharge status: Home / Self Care | Payer: BC Managed Care – PPO | Source: Ambulatory Visit | Attending: Neurology | Admitting: Neurology

## 2020-04-07 ENCOUNTER — Other Ambulatory Visit: Payer: Self-pay

## 2020-04-07 DIAGNOSIS — G932 Benign intracranial hypertension: Secondary | ICD-10-CM | POA: Insufficient documentation

## 2020-04-07 LAB — CSF CELL COUNT WITH DIFFERENTIAL
RBC Count, CSF: 1 /mm3 — ABNORMAL HIGH
Tube #: 1
WBC, CSF: 1 /mm3 (ref 0–5)

## 2020-04-07 LAB — PROTEIN, CSF: Total  Protein, CSF: 33 mg/dL (ref 15–45)

## 2020-04-07 LAB — GLUCOSE, CSF: Glucose, CSF: 65 mg/dL (ref 40–70)

## 2020-04-07 MED ORDER — ACETAMINOPHEN 325 MG PO TABS
ORAL_TABLET | ORAL | Status: AC
  Filled 2020-04-07: qty 1

## 2020-04-07 MED ORDER — ACETAMINOPHEN 325 MG PO TABS
650.0000 mg | ORAL_TABLET | Freq: Once | ORAL | Status: AC
  Administered 2020-04-07: 650 mg via ORAL

## 2020-04-07 MED ORDER — LIDOCAINE HCL (PF) 1 % IJ SOLN
INTRAMUSCULAR | Status: AC
  Filled 2020-04-07: qty 5

## 2020-04-07 MED ORDER — ACETAMINOPHEN 325 MG PO TABS
650.0000 mg | ORAL_TABLET | ORAL | Status: DC | PRN
  Filled 2020-04-07: qty 2

## 2020-04-07 MED ORDER — POVIDONE-IODINE 10 % EX SOLN
CUTANEOUS | Status: AC
  Filled 2020-04-07: qty 15

## 2020-04-07 NOTE — Discharge Instructions (Signed)
   Remain flat as much as possible for remainder of day , up to bathroom as needed       Lumbar Puncture, Care After This sheet gives you information about how to care for yourself after your procedure. Your health care provider may also give you more specific instructions. If you have problems or questions, contact your health care provider. What can I expect after the procedure? After the procedure, it is common to have:  Mild discomfort or pain at the puncture site.  A mild headache that is relieved with pain medicines. Follow these instructions at home: Activity   Lie down flat or rest for as long as directed by your health care provider.  Return to your normal activities as told by your health care provider. Ask your health care provider what activities are safe for you.  Avoid lifting anything heavier than 10 lb (4.5 kg) for at least 12 hours after the procedure.  Do not drive for 24 hours if you were given a medicine to help you relax (sedative) during your procedure.  Do not drive or use heavy machinery while taking prescription pain medicine. Puncture site care  Remove or change your bandage (dressing) as told by your health care provider.  Check your puncture area every day for signs of infection. Check for: ? More pain. ? Redness or swelling. ? Fluid or blood leaking from the puncture site. ? Warmth. ? Pus or a bad smell. General instructions  Take over-the-counter and prescription medicines only as told by your health care provider.  Drink enough fluids to keep your urine clear or pale yellow. Your health care provider may recommend drinking caffeine to prevent a headache.  Keep all follow-up visits as told by your health care provider. This is important. Contact a health care provider if:  You have fever or chills.  You have nausea or vomiting.  You have a headache that lasts for more than 2 days or does not get better with medicine. Get help right  away if:  You develop any of the following in your legs: ? Weakness. ? Numbness. ? Tingling.  You are unable to control when you urinate or have a bowel movement (incontinence).  You have signs of infection around your puncture site, such as: ? More pain. ? Redness or swelling. ? Fluid or blood leakage. ? Warmth. ? Pus or a bad smell.  You are dizzy or you feel like you might faint.  You have a severe headache, especially when you sit or stand. Summary  A lumbar puncture is a procedure in which a small needle is inserted into the lower back to remove fluid that surrounds the brain and spinal cord.  After this procedure, it is common to have a headache and pain around the needle insertion area.  Lying flat, staying hydrated, and drinking caffeine can help prevent headaches.  Monitor your needle insertion site for signs of infection, including warmth, fluid, or more pain.  Get help right away if you develop leg weakness, leg numbness, incontinence, or severe headaches. This information is not intended to replace advice given to you by your health care provider. Make sure you discuss any questions you have with your health care provider. Document Revised: 05/29/2016 Document Reviewed: 05/29/2016 Elsevier Patient Education  2020 [employer].

## 2020-04-07 NOTE — Discharge Instructions (Signed)
Lumbar Puncture, Care After This sheet gives you information about how to care for yourself after your procedure. Your health care provider may also give you more specific instructions. If you have problems or questions, contact your health care provider. What can I expect after the procedure? After the procedure, it is common to have:  Mild discomfort or pain at the puncture site.  A mild headache that is relieved with pain medicines. Follow these instructions at home: Activity   Lie down flat or rest for as long as directed by your health care provider.  Return to your normal activities as told by your health care provider. Ask your health care provider what activities are safe for you.  Avoid lifting anything heavier than 10 lb (4.5 kg) for at least 12 hours after the procedure.  Do not drive for 24 hours if you were given a medicine to help you relax (sedative) during your procedure.  Do not drive or use heavy machinery while taking prescription pain medicine. Puncture site care  Remove or change your bandage (dressing) as told by your health care provider.  Check your puncture area every day for signs of infection. Check for: ? More pain. ? Redness or swelling. ? Fluid or blood leaking from the puncture site. ? Warmth. ? Pus or a bad smell. General instructions  Take over-the-counter and prescription medicines only as told by your health care provider.  Drink enough fluids to keep your urine clear or pale yellow. Your health care provider may recommend drinking caffeine to prevent a headache.  Keep all follow-up visits as told by your health care provider. This is important. Contact a health care provider if:  You have fever or chills.  You have nausea or vomiting.  You have a headache that lasts for more than 2 days or does not get better with medicine. Get help right away if:  You develop any of the following in your  legs: ? Weakness. ? Numbness. ? Tingling.  You are unable to control when you urinate or have a bowel movement (incontinence).  You have signs of infection around your puncture site, such as: ? More pain. ? Redness or swelling. ? Fluid or blood leakage. ? Warmth. ? Pus or a bad smell.  You are dizzy or you feel like you might faint.  You have a severe headache, especially when you sit or stand. Summary  A lumbar puncture is a procedure in which a small needle is inserted into the lower back to remove fluid that surrounds the brain and spinal cord.  After this procedure, it is common to have a headache and pain around the needle insertion area.  Lying flat, staying hydrated, and drinking caffeine can help prevent headaches.  Monitor your needle insertion site for signs of infection, including warmth, fluid, or more pain.  Get help right away if you develop leg weakness, leg numbness, incontinence, or severe headaches. This information is not intended to replace advice given to you by your health care provider. Make sure you discuss any questions you have with your health care provider. Document Revised: 05/29/2016 Document Reviewed: 05/29/2016 Elsevier Patient Education  2020 [employer]  

## 2020-04-07 NOTE — Progress Notes (Signed)
Patient ambulated to bathroom without difficulty.

## 2020-04-07 NOTE — Procedures (Signed)
Preprocedure Dx: Benign intracranial hypertension Postprocedure Dx: Benign intracranial hypertension Procedure:  Fluoroscopically guided lumbar puncture Radiologist:  Thornton Papas Anesthesia:  3 ml of 1% lidocaine Specimen:  12 ml CSF, clear colorless EBL:   < 1 ml Opening pressure: 13 cm H2O Closing pressure: 9 cm R0Q Complications: None

## 2020-04-07 NOTE — Progress Notes (Signed)
Patient ambulated to car with mother Avonda Toso driving patient home. Mother and patient verbalized instructions.

## 2020-04-07 NOTE — Progress Notes (Signed)
Dr. Thornton Papas notified via telephone of patient complain of pain at insertion site and right hip. Order received. Patient resting quietly on stretcher.

## 2020-04-07 NOTE — Progress Notes (Signed)
Call bell within reach at patient side. Patient lying flat on stretcher. Denies any further medication, denies need for evaluation in Emergency department for pain. Mother Teya Otterson notified patient resting quietly.

## 2020-04-11 ENCOUNTER — Other Ambulatory Visit (HOSPITAL_COMMUNITY): Payer: Self-pay | Admitting: Neurology

## 2020-04-11 DIAGNOSIS — Z1231 Encounter for screening mammogram for malignant neoplasm of breast: Secondary | ICD-10-CM

## 2020-04-11 DIAGNOSIS — M545 Low back pain, unspecified: Secondary | ICD-10-CM

## 2020-04-13 ENCOUNTER — Other Ambulatory Visit: Payer: Self-pay

## 2020-04-13 ENCOUNTER — Ambulatory Visit (HOSPITAL_COMMUNITY)
Admission: RE | Admit: 2020-04-13 | Discharge: 2020-04-13 | Disposition: A | Discharge status: Home / Self Care | Payer: BC Managed Care – PPO | Source: Ambulatory Visit | Attending: Neurology | Admitting: Neurology

## 2020-04-13 DIAGNOSIS — M545 Low back pain, unspecified: Secondary | ICD-10-CM | POA: Insufficient documentation

## 2020-04-29 HISTORY — PX: ANKLE FRACTURE SURGERY: SHX122

## 2020-06-23 DIAGNOSIS — M545 Low back pain, unspecified: Secondary | ICD-10-CM | POA: Insufficient documentation

## 2020-07-13 ENCOUNTER — Other Ambulatory Visit: Payer: Self-pay

## 2020-07-13 ENCOUNTER — Encounter: Payer: Self-pay | Admitting: Nurse Practitioner

## 2020-07-13 ENCOUNTER — Ambulatory Visit: Payer: BC Managed Care – PPO | Admitting: Nurse Practitioner

## 2020-07-13 VITALS — BP 122/78 | HR 73 | Temp 97.3°F | Ht 67.0 in | Wt 192.6 lb

## 2020-07-13 DIAGNOSIS — K219 Gastro-esophageal reflux disease without esophagitis: Secondary | ICD-10-CM

## 2020-07-13 DIAGNOSIS — K582 Mixed irritable bowel syndrome: Secondary | ICD-10-CM | POA: Diagnosis not present

## 2020-07-13 DIAGNOSIS — R1319 Other dysphagia: Secondary | ICD-10-CM

## 2020-07-13 DIAGNOSIS — K625 Hemorrhage of anus and rectum: Secondary | ICD-10-CM | POA: Diagnosis not present
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
## 2020-07-13 NOTE — Patient Instructions (Addendum)
Your health issues we discussed today were:   IBS: 1. Continue your current medications, including fiber supplement 2. Try taking over-the-counter Colace 100 mg (gentle stool softener) once a day or once every other day for constipation 3. Stop taking Colace if you develop diarrhea 4. Call us if you have any worsening symptoms or ongoing bleeding  GERD (reflux/heartburn) with dysphagia (swallowing difficulties): 1. We will schedule an upper endoscopy to further evaluate and treat your swallowing difficulties 2. Try to eat softer foods in the meantime.  I am printing further information below 3. Continue your other current medications especially your acid blocker 4. Call us for any worsening or severe symptoms 5. If food gets stuck and will not go forward or will not go backward for more than 3 hours, especially if you are unable to handle fluids or your own saliva and proceed to the emergency room  Overall I recommend:  1. Continue other current medications 2. I highly encourage you to speak to your primary care about options to help manage your ongoing, long-term, high levels of anxiety and stress 3. Call us if you have any questions or concerns   ---------------------------------------------------------------  I am glad you have gotten your COVID-19 vaccination!  Even though you are fully vaccinated you should continue to follow CDC and state/local guidelines.  ---------------------------------------------------------------   At [facility] we value your feedback. You may receive a survey about your visit today. Please share your experience as we strive to create trusting relationships with our patients to provide genuine, compassionate, quality care.  We appreciate your understanding and patience as we review any laboratory studies, imaging, and other diagnostic tests that are ordered as we care for you. Our office policy is 5 business days for review of these  results, and any emergent or urgent results are addressed in a timely manner for your best interest. If you do not hear from our office in 1 week, please contact us.   We also encourage the use of MyChart, which contains your medical information for your review as well. If you are not enrolled in this feature, an access code is on this after visit summary for your convenience. Thank you for allowing [place] to be involved in your care.  It was great to see you today!  I hope you have a great spring!!      Dysphagia Eating Plan, Bite Size Food This eating plan is for people with moderate swallowing problems who have transitioned from pureed and minced foods. Bite size foods are soft and cut into small chunks so that they can be swallowed safely. On this eating plan, you may be instructed to drink liquids that are thickened. Work with your health care provider and your diet and nutrition specialist (dietitian) to make sure that you are following the eating plan safely and getting all the nutrients you need. What are tips for following this plan? General information  You may eat foods that are tender, soft, and moist.  Always test food texture before taking a bite. Poke food with a fork or spoon to make sure it is tender. The test sample should squash, break apart, or change shape, and it should not return to its original shape when the fork or spoon is removed.  Food should be easy to cut and chew. Avoid large pieces of food that require a lot of chewing.  Take small bites. Each bite should be smaller than your thumb nail (about 15 mm by 15 mm).  If you were on a pureed or minced food eating plan, you may eat any of the foods included in those diets.  Avoid foods that are very dry, hard, sticky, chewy, coarse, or crunchy.  If instructed by your health care provider, thicken liquids. Follow your health care provider's instructions for what products to use, how to do this, and to what  thickness. ? Your health care provider may recommend using a commercial thickener, rice cereal, or potato flakes. Ask your health care provider to recommend thickeners. ? Thickened liquids are usually a "pudding-like" consistency, or they may be as thick as honey or thick enough to eat with a spoon.   Cooking  To moisten foods, you may add liquids while you are blending, mashing, or grinding your foods to the right consistency. These liquids include gravies, sauces, vegetable or fruit juice, milk, half and half, or water.  Strain extra liquid from foods before eating.  Reheat foods slowly to prevent a tough crust from forming.  Prepare foods in advance. Meal planning  Eat a variety of foods to get all the nutrients you need.  Some foods may be tolerated better than others. Work with your dietitian to identify which foods are safest for you to eat.  Follow your meal plan as told by your dietitian. What foods can I eat? Grains Moist breads without nuts or seeds. Biscuits, muffins, pancakes, and waffles that are well-moistened with syrup, jelly, margarine, or butter. Cooked cereals. Moist bread stuffing. Moist rice. Well-moistened cold cereal with small chunks. Well-cooked pasta, noodles, rice, and bread dressing in small pieces and thick sauce. Soft dumplings or spaetzle in small pieces and butter or gravy. Fruits Canned or cooked fruits that are soft or moist and do not have skin or seeds. Fresh, soft bananas. Thickened fruit juices. Vegetables Soft, well-cooked vegetables in small pieces. Soft-cooked, mashed potatoes. Thickened vegetable juice. Meats and other proteins Tender, moist meats or poultry in small pieces. Moist meatballs or meatloaf. Fish without bones. Eggs or egg substitutes in small pieces. Tofu. Tempeh and meat alternatives in small pieces. Well-cooked, tender beans, peas, baked beans, and other legumes. Dairy Thickened milk. Cream cheese. Yogurt. Cottage cheese. Sour  cream. Small pieces of soft cheese. Fats and oils Butter. Oils. Margarine. Mayonnaise. Gravy. Spreads. Sweets and desserts Soft, smooth, moist desserts. Pudding. Custard. Moist cakes. Jam. Jelly. Honey. Preserves. Ask your health care provider whether you can have frozen desserts. Seasonings and other foods All seasonings and sweeteners. All sauces with small chunks. Prepared tuna, egg, or chicken salad without raw fruits or vegetables. Moist casseroles with small, tender pieces of meat. Soups with tender meat. The items listed above may not be a complete list of foods and beverages you can eat. Contact a dietitian for more information. What foods must I avoid? Grains Coarse or dry cereals. Dry breads. Toast. Crackers. Tough, crusty breads, such as [place] bread and baguettes. Dry pancakes, waffles, and muffins. Sticky rice. Dry bread stuffing. Granola. Popcorn. Chips. Fruits Hard, crunchy, stringy, high-pulp, and juicy raw fruits such as apples, pineapple, papaya, and watermelon. Small, round fruits, such as grapes. Dried fruit and fruit leather. Vegetables All raw vegetables. Cooked corn. Rubbery or stiff cooked vegetables. Stringy vegetables, such as celery. Tough, crisp fried potatoes. Potato skins. Meats and other proteins Large pieces of meat. Dry, tough meats, such as bacon, sausage, and hot dogs. Chicken, [place], or fish with skin and bones. Crunchy peanut butter. Nuts. Seeds. Nut and seed butters. Dairy Yogurt with nuts,  seeds, or large chunks. Large chunks of cheese. Sweets and desserts Dry cakes. Chewy or dry cookies. Any desserts with nuts, seeds, dry fruits, coconut, pineapple, or anything dry, sticky, or hard. Chewy caramel. Licorice. Taffy-type candies. Ask your health care provider whether you can have frozen desserts. Seasonings and other foods Soups with tough or large chunks of meats, poultry, or vegetables. Corn or clam chowder. Smoothies with large chunks of fruit. The  items listed above may not be a complete list of foods and beverages you should avoid. Contact a dietitian for more information. Summary  Bite size foods can be helpful for people with moderate swallowing problems.  On this dysphagia eating plan, you may eat foods that are soft, moist, and cut into pieces smaller than your thumb nail (about 15 mm by 15 mm).  You may be instructed to thicken liquids. Follow your health care provider's instructions about how to do this and to what consistency. This information is not intended to replace advice given to you by your health care provider. Make sure you discuss any questions you have with your health care provider. Document Revised: 01/03/2020 Document Reviewed: 01/03/2020 Elsevier Patient Education  2021 [employer].

## 2020-07-18 NOTE — Progress Notes (Signed)
CC'ED TO PCP 

## 2020-08-21 ENCOUNTER — Other Ambulatory Visit: Payer: Self-pay

## 2020-08-21 ENCOUNTER — Other Ambulatory Visit (HOSPITAL_COMMUNITY)
Admission: RE | Admit: 2020-08-21 | Discharge: 2020-08-21 | Disposition: A | Discharge status: Home / Self Care | Payer: BC Managed Care – PPO | Source: Ambulatory Visit | Attending: Internal Medicine | Admitting: Internal Medicine

## 2020-08-21 DIAGNOSIS — Z01812 Encounter for preprocedural laboratory examination: Secondary | ICD-10-CM | POA: Insufficient documentation

## 2020-08-21 DIAGNOSIS — Z882 Allergy status to sulfonamides status: Secondary | ICD-10-CM | POA: Diagnosis not present

## 2020-08-21 DIAGNOSIS — J219 Acute bronchiolitis, unspecified: Secondary | ICD-10-CM | POA: Diagnosis not present

## 2020-08-21 DIAGNOSIS — R1310 Dysphagia, unspecified: Secondary | ICD-10-CM | POA: Diagnosis not present

## 2020-08-21 DIAGNOSIS — Z79899 Other long term (current) drug therapy: Secondary | ICD-10-CM | POA: Diagnosis not present

## 2020-08-21 DIAGNOSIS — Z20822 Contact with and (suspected) exposure to covid-19: Secondary | ICD-10-CM | POA: Insufficient documentation

## 2020-08-21 DIAGNOSIS — Z800 Family history of malignant neoplasm of digestive organs: Secondary | ICD-10-CM | POA: Diagnosis not present

## 2020-08-22 LAB — SARS CORONAVIRUS 2 (TAT 6-24 HRS): SARS Coronavirus 2: NEGATIVE

## 2020-08-23 ENCOUNTER — Encounter (HOSPITAL_COMMUNITY)
Admission: RE | Disposition: A | Discharge status: Home / Self Care | Payer: Self-pay | Source: Home / Self Care | Attending: Internal Medicine

## 2020-08-23 ENCOUNTER — Other Ambulatory Visit: Payer: Self-pay

## 2020-08-23 ENCOUNTER — Ambulatory Visit (HOSPITAL_COMMUNITY)
Admission: RE | Admit: 2020-08-23 | Discharge: 2020-08-23 | Disposition: A | Discharge status: Home / Self Care | Payer: BC Managed Care – PPO | Attending: Internal Medicine | Admitting: Internal Medicine

## 2020-08-23 ENCOUNTER — Encounter (HOSPITAL_COMMUNITY): Payer: Self-pay | Admitting: Internal Medicine

## 2020-08-23 DIAGNOSIS — R1310 Dysphagia, unspecified: Secondary | ICD-10-CM

## 2020-08-23 DIAGNOSIS — Z882 Allergy status to sulfonamides status: Secondary | ICD-10-CM | POA: Insufficient documentation

## 2020-08-23 DIAGNOSIS — Z20822 Contact with and (suspected) exposure to covid-19: Secondary | ICD-10-CM | POA: Insufficient documentation

## 2020-08-23 DIAGNOSIS — Z800 Family history of malignant neoplasm of digestive organs: Secondary | ICD-10-CM | POA: Insufficient documentation

## 2020-08-23 DIAGNOSIS — Z79899 Other long term (current) drug therapy: Secondary | ICD-10-CM | POA: Insufficient documentation

## 2020-08-23 DIAGNOSIS — J219 Acute bronchiolitis, unspecified: Secondary | ICD-10-CM | POA: Insufficient documentation

## 2020-08-23 HISTORY — PX: ESOPHAGOGASTRODUODENOSCOPY: SHX5428

## 2020-08-23 HISTORY — PX: MALONEY DILATION: SHX5535

## 2020-08-23 SURGERY — EGD (ESOPHAGOGASTRODUODENOSCOPY)
Anesthesia: Moderate Sedation

## 2020-08-23 MED ORDER — LIDOCAINE VISCOUS HCL 2 % MT SOLN
OROMUCOSAL | Status: AC
  Filled 2020-08-23: qty 15

## 2020-08-23 MED ORDER — MEPERIDINE HCL 50 MG/ML IJ SOLN
INTRAMUSCULAR | Status: AC
  Filled 2020-08-23: qty 1

## 2020-08-23 MED ORDER — ONDANSETRON HCL 4 MG/2ML IJ SOLN
INTRAMUSCULAR | Status: AC
  Filled 2020-08-23: qty 2

## 2020-08-23 MED ORDER — ONDANSETRON HCL 4 MG/2ML IJ SOLN
INTRAMUSCULAR | Status: DC | PRN
  Administered 2020-08-23: 4 mg via INTRAVENOUS

## 2020-08-23 MED ORDER — LIDOCAINE VISCOUS HCL 2 % MT SOLN
OROMUCOSAL | Status: DC | PRN
  Administered 2020-08-23: 5 mL via OROMUCOSAL

## 2020-08-23 MED ORDER — MIDAZOLAM HCL 5 MG/5ML IJ SOLN
INTRAMUSCULAR | Status: DC | PRN
  Administered 2020-08-23 (×3): 1 mg via INTRAVENOUS
  Administered 2020-08-23: 2 mg via INTRAVENOUS

## 2020-08-23 MED ORDER — SODIUM CHLORIDE 0.9 % IV SOLN
INTRAVENOUS | Status: DC
  Administered 2020-08-23: 1000 mL via INTRAVENOUS

## 2020-08-23 MED ORDER — MIDAZOLAM HCL 5 MG/5ML IJ SOLN
INTRAMUSCULAR | Status: AC
  Filled 2020-08-23: qty 10

## 2020-08-23 MED ORDER — MEPERIDINE HCL 100 MG/ML IJ SOLN
INTRAMUSCULAR | Status: DC | PRN
  Administered 2020-08-23: 40 mg via INTRAVENOUS
  Administered 2020-08-23: 10 mg via INTRAVENOUS

## 2020-08-23 MED ORDER — STERILE WATER FOR IRRIGATION IR SOLN: Status: DC | PRN

## 2020-08-23 NOTE — Op Note (Signed)
[facility] Patient Name: Sara Pruitt Procedure Date: 08/23/2020 12:57 PM MRN: [id number] Date of Birth: 1976-02-07 Attending MD: Norvel Richards , MD CSN: [id number] Age: 45 Admit Type: Outpatient Procedure:                Upper GI endoscopy Indications:              Dysphagia Providers:                Norvel Richards, MD, Jeanann Lewandowsky. Sharon Seller, RN,                            Raphael Gibney, Technician Referring MD:              Medicines:                Midazolam 5 mg IV, Meperidine 50 mg IV Complications:            No immediate complications. Estimated Blood Loss:     Estimated blood loss: none. Procedure:                Pre-Anesthesia Assessment:                           - Prior to the procedure, a History and Physical                            was performed, and patient medications and                            allergies were reviewed. The patient's tolerance of                            previous anesthesia was also reviewed. The risks                            and benefits of the procedure and the sedation                            options and risks were discussed with the patient.                            All questions were answered, and informed consent                            was obtained. Prior Anticoagulants: The patient has                            taken no previous anticoagulant or antiplatelet                            agents. ASA Grade Assessment: II - A patient with                            mild systemic disease. After reviewing the risks  and benefits, the patient was deemed in                            satisfactory condition to undergo the procedure.                           After obtaining informed consent, the endoscope was                            passed under direct vision. Throughout the                            procedure, the patient's blood pressure, pulse, and                             oxygen saturations were monitored continuously. The                            [id number]-H190 ([id number]) scope was introduced through the                            mouth, and advanced to the second part of duodenum.                            The upper GI endoscopy was accomplished without                            difficulty. The patient tolerated the procedure                            well. Scope In: 1:32:55 PM Scope Out: 1:38:31 PM Total Procedure Duration: 0 hours 5 minutes 36 seconds  Findings:      The examined esophagus was normal. The scope was withdrawn. Dilation was       performed with a Maloney dilator with mild resistance at 37 Fr. The       dilation site was examined following endoscope reinsertion and showed no       change. Estimated blood loss: none.      The entire examined stomach was normal.      The duodenal bulb and second portion of the duodenum were normal. Impression:               - Normal esophagus. Dilated.                           - Normal stomach.                           - Normal duodenal bulb and second portion of the                            duodenum.                           - No specimens collected. I suspect an element of  nonerosive reflux disease. There may be functional                            overlay. Moderate Sedation:      Moderate (conscious) sedation was administered by the endoscopy nurse       and supervised by the endoscopist. The following parameters were       monitored: oxygen saturation, heart rate, blood pressure, respiratory       rate, EKG, adequacy of pulmonary ventilation, and response to care.       Total physician intraservice time was 16 minutes. Recommendation:           - Patient has a contact number available for                            emergencies. The signs and symptoms of potential                            delayed complications were discussed with the                             patient. Return to normal activities tomorrow.                            Written discharge instructions were provided to the                            patient.                           - Resume previous diet.                           - Continue present medications. Continue Protonix                            40 mg twice daily (take before breakfast and supper)                           - Return to GI clinic in 4 weeks. Procedure Code(s):        --- Professional ---                           [phone], Esophagogastroduodenoscopy, flexible,                            transoral; diagnostic, including collection of                            specimen(s) by brushing or washing, when performed                            (separate procedure)                           [id number], Dilation of esophagus, by unguided sound or  bougie, single or multiple passes                           G0500, Moderate sedation services provided by the                            same physician or other qualified health care                            professional performing a gastrointestinal                            endoscopic service that sedation supports,                            requiring the presence of an independent trained                            observer to assist in the monitoring of the                            patient's level of consciousness and physiological                            status; initial 15 minutes of intra-service time;                            patient age 68 years or older (additional time may                            be reported with [phone], as appropriate) Diagnosis Code(s):        --- Professional ---                           R13.10, Dysphagia, unspecified CPT copyright 2019 [employer] Association. All rights reserved. The codes documented in this report are preliminary and upon coder review may  be revised to meet current compliance  requirements. [name]. Vincent Streater, MD Norvel Richards, MD 08/23/2020 1:49:30 PM This report has been signed electronically. Number of Addenda: 0

## 2020-08-23 NOTE — Discharge Instructions (Signed)
EGD Discharge instructions Please read the instructions outlined below and refer to this sheet in the next few weeks. These discharge instructions provide you with general information on caring for yourself after you leave the hospital. Your doctor may also give you specific instructions. While your treatment has been planned according to the most current medical practices available, unavoidable complications occasionally occur. If you have any problems or questions after discharge, please call your doctor. ACTIVITY  You may resume your regular activity but move at a slower pace for the next 24 hours.   Take frequent rest periods for the next 24 hours.   Walking will help expel (get rid of) the air and reduce the bloated feeling in your abdomen.   No driving for 24 hours (because of the anesthesia (medicine) used during the test).   You may shower.   Do not sign any important legal documents or operate any machinery for 24 hours (because of the anesthesia used during the test).  NUTRITION  Drink plenty of fluids.   You may resume your normal diet.   Begin with a light meal and progress to your normal diet.   Avoid alcoholic beverages for 24 hours or as instructed by your caregiver.  MEDICATIONS  You may resume your normal medications unless your caregiver tells you otherwise.  WHAT YOU CAN EXPECT TODAY  You may experience abdominal discomfort such as a feeling of fullness or "gas" pains.  FOLLOW-UP  Your doctor will discuss the results of your test with you.  SEEK IMMEDIATE MEDICAL ATTENTION IF ANY OF THE FOLLOWING OCCUR:  Excessive nausea (feeling sick to your stomach) and/or vomiting.   Severe abdominal pain and distention (swelling).   Trouble swallowing.   Temperature over 101 F (37.8 C).   Rectal bleeding or vomiting of blood.    Your upper endoscopy was normal today.  Your esophagus was dilated  For now, continue Protonix 40 mg twice daily (take before  breakfast and supper)  Office visit with [place] in 4 weeks  At patient request, I called Terry at [phone] -rolled to voicemail.  "Not set up"
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
## 2020-08-25 ENCOUNTER — Encounter (HOSPITAL_COMMUNITY): Payer: Self-pay | Admitting: Internal Medicine

## 2020-09-13 ENCOUNTER — Other Ambulatory Visit: Payer: Self-pay | Admitting: Gastroenterology

## 2020-09-13 DIAGNOSIS — K219 Gastro-esophageal reflux disease without esophagitis: Secondary | ICD-10-CM

## 2020-09-13 DIAGNOSIS — K582 Mixed irritable bowel syndrome: Secondary | ICD-10-CM

## 2020-09-13 DIAGNOSIS — K625 Hemorrhage of anus and rectum: Secondary | ICD-10-CM

## 2020-10-23 DIAGNOSIS — M659 Synovitis and tenosynovitis, unspecified: Secondary | ICD-10-CM | POA: Insufficient documentation

## 2020-10-23 DIAGNOSIS — S93401A Sprain of unspecified ligament of right ankle, initial encounter: Secondary | ICD-10-CM | POA: Insufficient documentation

## 2020-10-23 DIAGNOSIS — M25373 Other instability, unspecified ankle: Secondary | ICD-10-CM | POA: Insufficient documentation

## 2020-10-23 DIAGNOSIS — S93439A Sprain of tibiofibular ligament of unspecified ankle, initial encounter: Secondary | ICD-10-CM | POA: Insufficient documentation
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
## 2020-11-17 ENCOUNTER — Encounter: Payer: Self-pay | Admitting: Internal Medicine

## 2020-11-17 ENCOUNTER — Ambulatory Visit: Payer: BC Managed Care – PPO | Admitting: Gastroenterology

## 2020-12-12 ENCOUNTER — Other Ambulatory Visit: Payer: Self-pay | Admitting: Physician Assistant

## 2020-12-12 ENCOUNTER — Ambulatory Visit
Admission: RE | Admit: 2020-12-12 | Discharge: 2020-12-12 | Disposition: A | Discharge status: Home / Self Care | Payer: No Typology Code available for payment source | Source: Ambulatory Visit | Attending: Physician Assistant | Admitting: Physician Assistant

## 2020-12-12 ENCOUNTER — Emergency Department (HOSPITAL_COMMUNITY)
Admission: EM | Admit: 2020-12-12 | Discharge: 2020-12-12 | Disposition: A | Discharge status: Home / Self Care | Payer: No Typology Code available for payment source | Attending: Emergency Medicine | Admitting: Emergency Medicine

## 2020-12-12 ENCOUNTER — Encounter (HOSPITAL_COMMUNITY): Payer: Self-pay | Admitting: Emergency Medicine

## 2020-12-12 ENCOUNTER — Other Ambulatory Visit: Payer: Self-pay

## 2020-12-12 DIAGNOSIS — Z09 Encounter for follow-up examination after completed treatment for conditions other than malignant neoplasm: Secondary | ICD-10-CM

## 2020-12-12 DIAGNOSIS — Z7901 Long term (current) use of anticoagulants: Secondary | ICD-10-CM | POA: Diagnosis not present

## 2020-12-12 DIAGNOSIS — I82431 Acute embolism and thrombosis of right popliteal vein: Secondary | ICD-10-CM | POA: Diagnosis not present

## 2020-12-12 DIAGNOSIS — Z79899 Other long term (current) drug therapy: Secondary | ICD-10-CM | POA: Diagnosis not present

## 2020-12-12 DIAGNOSIS — Z7982 Long term (current) use of aspirin: Secondary | ICD-10-CM | POA: Insufficient documentation

## 2020-12-12 DIAGNOSIS — O0289 Other abnormal products of conception: Secondary | ICD-10-CM | POA: Insufficient documentation

## 2020-12-12 DIAGNOSIS — I10 Essential (primary) hypertension: Secondary | ICD-10-CM | POA: Diagnosis not present

## 2020-12-12 DIAGNOSIS — M25571 Pain in right ankle and joints of right foot: Secondary | ICD-10-CM | POA: Diagnosis present

## 2020-12-12 DIAGNOSIS — M79661 Pain in right lower leg: Secondary | ICD-10-CM | POA: Insufficient documentation

## 2020-12-12 LAB — I-STAT CHEM 8, ED
BUN: 16 mg/dL (ref 6–20)
Calcium, Ion: 0.82 mmol/L — CL (ref 1.15–1.40)
Chloride: 118 mmol/L — ABNORMAL HIGH (ref 98–111)
Creatinine, Ser: 1 mg/dL (ref 0.44–1.00)
Glucose, Bld: 73 mg/dL (ref 70–99)
HCT: 33 % — ABNORMAL LOW (ref 36.0–46.0)
Hemoglobin: 11.2 g/dL — ABNORMAL LOW (ref 12.0–15.0)
Potassium: 3.4 mmol/L — ABNORMAL LOW (ref 3.5–5.1)
Sodium: 140 mmol/L (ref 135–145)
TCO2: 17 mmol/L — ABNORMAL LOW (ref 22–32)

## 2020-12-12 MED ORDER — RIVAROXABAN 15 MG PO TABS
15.0000 mg | ORAL_TABLET | Freq: Once | ORAL | Status: AC
  Administered 2020-12-12: 15 mg via ORAL
  Filled 2020-12-12: qty 1

## 2020-12-12 MED ORDER — RIVAROXABAN (XARELTO) VTE STARTER PACK (15 & 20 MG): ORAL_TABLET | ORAL | 0 refills | Status: DC

## 2020-12-12 NOTE — ED Notes (Signed)
RN to room for call bell. Pt had asked for a pillow to go under her foot. Upon entry patient had stated that her foot feels swollen and is to the point that it hurts, that she can barely wiggle her toes. RN gave pillow to place. Pt has on a splint from a prior surgery. She states she is here for a known DVT in that leg.  No assigned attending yet. Will notify EDP

## 2020-12-12 NOTE — ED Triage Notes (Signed)
Pt had right ankle surgery 3 weeks ago. Today pt had ultrasound ordered today by ortho. Pt called and notified that DVT in right popliteal and peroneal veins was found and come to ED for eval.
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
## 2020-12-12 NOTE — Discharge Instructions (Addendum)
Take the Xarelto as directed.  Follow-up with your primary care doctor for further evaluation.  First dose given to here tonight.  As we discussed return for any chest pain or shortness of breath.

## 2021-02-19 DIAGNOSIS — M792 Neuralgia and neuritis, unspecified: Secondary | ICD-10-CM | POA: Insufficient documentation

## 2021-02-19 DIAGNOSIS — R600 Localized edema: Secondary | ICD-10-CM | POA: Insufficient documentation

## 2021-03-10 ENCOUNTER — Other Ambulatory Visit: Payer: Self-pay | Admitting: Gastroenterology

## 2021-03-10 DIAGNOSIS — K625 Hemorrhage of anus and rectum: Secondary | ICD-10-CM

## 2021-03-10 DIAGNOSIS — K582 Mixed irritable bowel syndrome: Secondary | ICD-10-CM

## 2021-03-10 DIAGNOSIS — K219 Gastro-esophageal reflux disease without esophagitis: Secondary | ICD-10-CM

## 2021-03-26 DIAGNOSIS — M792 Neuralgia and neuritis, unspecified: Secondary | ICD-10-CM | POA: Insufficient documentation
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
## 2021-06-09 ENCOUNTER — Other Ambulatory Visit: Payer: Self-pay

## 2021-06-09 ENCOUNTER — Encounter: Payer: Self-pay | Admitting: Emergency Medicine

## 2021-06-09 ENCOUNTER — Ambulatory Visit
Admission: EM | Admit: 2021-06-09 | Discharge: 2021-06-09 | Disposition: A | Discharge status: Home / Self Care | Payer: BC Managed Care – PPO | Attending: Family Medicine | Admitting: Family Medicine

## 2021-06-09 DIAGNOSIS — J209 Acute bronchitis, unspecified: Secondary | ICD-10-CM | POA: Diagnosis not present

## 2021-06-09 DIAGNOSIS — J0100 Acute maxillary sinusitis, unspecified: Secondary | ICD-10-CM

## 2021-06-09 DIAGNOSIS — J3089 Other allergic rhinitis: Secondary | ICD-10-CM

## 2021-06-09 DIAGNOSIS — Z20828 Contact with and (suspected) exposure to other viral communicable diseases: Secondary | ICD-10-CM | POA: Diagnosis not present

## 2021-06-09 MED ORDER — FLUTICASONE PROPIONATE 50 MCG/ACT NA SUSP: 1.0000 | Freq: Two times a day (BID) | NASAL | 2 refills | Status: DC

## 2021-06-09 MED ORDER — PREDNISONE 20 MG PO TABS: 40.0000 mg | ORAL_TABLET | Freq: Every day | ORAL | 0 refills | Status: DC

## 2021-06-09 MED ORDER — CETIRIZINE HCL 10 MG PO TABS: 10.0000 mg | ORAL_TABLET | Freq: Every day | ORAL | 2 refills | Status: DC

## 2021-06-09 MED ORDER — AMOXICILLIN-POT CLAVULANATE 875-125 MG PO TABS: 1.0000 | ORAL_TABLET | Freq: Two times a day (BID) | ORAL | 0 refills | Status: DC
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
## 2021-06-09 NOTE — ED Triage Notes (Signed)
Pt reports sore throat, chills, cough intermittent for last several weeks. Pt denies any known fever.

## 2021-06-11 LAB — COVID-19, FLU A+B NAA
Influenza A, NAA: NOT DETECTED
Influenza B, NAA: NOT DETECTED
SARS-CoV-2, NAA: NOT DETECTED

## 2021-06-17 ENCOUNTER — Emergency Department (HOSPITAL_COMMUNITY): Payer: BC Managed Care – PPO

## 2021-06-17 ENCOUNTER — Encounter (HOSPITAL_COMMUNITY): Payer: Self-pay

## 2021-06-17 ENCOUNTER — Emergency Department (HOSPITAL_COMMUNITY)
Admission: EM | Admit: 2021-06-17 | Discharge: 2021-06-17 | Disposition: A | Discharge status: Home / Self Care | Payer: BC Managed Care – PPO | Attending: Emergency Medicine | Admitting: Emergency Medicine

## 2021-06-17 ENCOUNTER — Other Ambulatory Visit: Payer: Self-pay

## 2021-06-17 DIAGNOSIS — Z79899 Other long term (current) drug therapy: Secondary | ICD-10-CM | POA: Insufficient documentation

## 2021-06-17 DIAGNOSIS — B9789 Other viral agents as the cause of diseases classified elsewhere: Secondary | ICD-10-CM | POA: Insufficient documentation

## 2021-06-17 DIAGNOSIS — J069 Acute upper respiratory infection, unspecified: Secondary | ICD-10-CM | POA: Insufficient documentation

## 2021-06-17 DIAGNOSIS — Z7982 Long term (current) use of aspirin: Secondary | ICD-10-CM | POA: Insufficient documentation

## 2021-06-17 DIAGNOSIS — R059 Cough, unspecified: Secondary | ICD-10-CM | POA: Diagnosis present

## 2021-06-17 MED ORDER — BENZONATATE 100 MG PO CAPS: 100.0000 mg | ORAL_CAPSULE | Freq: Three times a day (TID) | ORAL | 0 refills | Status: DC

## 2021-06-17 NOTE — Discharge Instructions (Signed)
Likely a viral infection, recommend over-the-counter pain medications like ibuprofen Tylenol for fever and pain control, nasal decongestions like Flonase and Zyrtec, Mucinex for cough.  If not eating recommend supplementing with Gatorade to help with electrolyte supplementation.  Follow-up PCP for further evaluation.  Come back to the emergency department if you develop chest pain, shortness of breath, severe abdominal pain, uncontrolled nausea, vomiting, diarrhea.  

## 2021-06-17 NOTE — ED Provider Notes (Signed)
[facility] EMERGENCY DEPARTMENT Provider Note   CSN: [id number] Arrival date & time: 06/17/21  1316     History  Chief Complaint  Patient presents with   Cough    Sara Pruitt is a 46 y.o. female.  HPI  Patient with significant medical history presents with complaints of URI-like symptoms.  Patient states symptoms have been going on for about 3 weeks time, she endorses nasal congestion and a productive cough, she denies any fevers, chills, chest pain, shortness of breath, stomach pains, nausea, vomit, diarrhea, general body aches.  States that she went to urgent care 1 week ago was started on antibiotics as well as steroids, she states she still feels about the same.  She is concerned about her cough that is keeping her up at night.  She is tolerating p.o., she is vaccinated COVID influenza, she is not immunocompromise.  Home Medications Prior to Admission medications   Medication Sig Start Date End Date Taking? Authorizing Provider  acetaZOLAMIDE (DIAMOX) 500 MG capsule Take 500 mg by mouth daily. Patient not taking: No sig reported 07/13/20   [provider]  amoxicillin-clavulanate (AUGMENTIN) 875-125 MG tablet Take 1 tablet by mouth every 12 (twelve) hours. 06/09/21   Volney American, PA-C  aspirin EC 81 MG tablet Take 81 mg by mouth daily. Swallow whole.    [provider]  benzonatate (TESSALON) 100 MG capsule Take 1 capsule (100 mg total) by mouth every 8 (eight) hours. 06/17/21   Marcello Fennel, PA-C  cetirizine (ZYRTEC ALLERGY) 10 MG tablet Take 1 tablet (10 mg total) by mouth daily. 06/09/21   Volney American, PA-C  EMGALITY 120 MG/ML SOAJ Inject 1 mL into the skin every 30 (thirty) days. 11/27/20   [provider]  Erenumab-aooe (AIMOVIG) 140 MG/ML SOAJ Inject 140 mg as directed every 30 (thirty) days. Once a month Patient not taking: No sig reported    [provider]  fluticasone (FLONASE) 50 MCG/ACT nasal spray  Place 1 spray into both nostrils 2 (two) times daily. 06/09/21   Volney American, PA-C  hydrochlorothiazide (HYDRODIURIL) 25 MG tablet Take 12.5 mg by mouth daily.  10/02/17   [provider]  imipramine (TOFRANIL) 10 MG tablet Take 5 mg by mouth at bedtime. Patient not taking: No sig reported    [provider]  ketoconazole (NIZORAL) 2 % shampoo Apply 1 application topically 2 (two) times a week. 03/22/20   [provider]  meloxicam (MOBIC) 15 MG tablet Take 15 mg by mouth daily as needed for pain. Patient not taking: No sig reported    [provider]  Methylcellulose, Laxative, (FIBER THERAPY) 500 MG TABS Take 500 mg by mouth daily.    [provider]  metoprolol tartrate (LOPRESSOR) 25 MG tablet Take 12.5-25 mg by mouth 2 (two) times daily. Patient not taking: No sig reported 10/31/20   [provider]  Multiple Vitamin (MULTIVITAMIN WITH MINERALS) TABS tablet Take 1 tablet by mouth daily.    [provider]  ondansetron (ZOFRAN-ODT) 4 MG disintegrating tablet Take 4 mg by mouth 2 (two) times daily.    [provider]  oxyCODONE (OXY IR/ROXICODONE) 5 MG immediate release tablet Take 5 mg by mouth See admin instructions. Take 1 tablet by mouth every 4 to 6 hours as needed for pain    [provider]  pantoprazole (PROTONIX) 40 MG tablet TAKE (1) TABLET BY MOUTH TWICE DAILY BEFORE A MEAL. 03/13/21   [name]  S, PA-C  phentermine (ADIPEX-P) 37.5 MG tablet Take 37.5 mg by mouth every morning. Patient not taking: No sig reported 05/23/20   [provider]  predniSONE (DELTASONE) 20 MG tablet Take 2 tablets (40 mg total) by mouth daily with breakfast. 06/09/21   Volney American, PA-C  pregabalin (LYRICA) 75 MG capsule Take 75 mg by mouth 2 (two) times daily.    [provider]  Probiotic Product (PROBIOTIC DAILY PO) Take 1 tablet by mouth daily.     [provider]  RIVAROXABAN  Alveda Reasons) VTE STARTER PACK (15 & 20 MG) Follow package directions: Take one 15mg  tablet by mouth twice a day. On day 22, switch to one 20mg  tablet once a day. Take with food. 12/12/20   Fredia Sorrow, MD  TROKENDI XR 50 MG CP24 Take 1 capsule by mouth daily. 12/01/20   [provider]  vitamin C (ASCORBIC ACID) 500 MG tablet Take 1,000 mg by mouth daily.    [provider]      Allergies    Sulfa antibiotics, Strawberry extract, and Sulfonamide derivatives    Review of Systems   Review of Systems  Constitutional:  Negative for chills and fever.  HENT:  Positive for congestion.   Respiratory:  Positive for cough. Negative for shortness of breath.   Cardiovascular:  Negative for chest pain.  Gastrointestinal:  Negative for abdominal pain.  Neurological:  Negative for headaches.   Physical Exam Updated Vital Signs BP (!) 147/93    Pulse 87    Temp 98.3 F (36.8 C) (Oral)    Resp 18    Ht 5\' 7"  (1.702 m)    Wt 83.9 kg    SpO2 100%    BMI 28.98 kg/m  Physical Exam Vitals and nursing note reviewed.  Constitutional:      General: She is not in acute distress.    Appearance: She is not ill-appearing.  HENT:     Head: Normocephalic and atraumatic.     Right Ear: Tympanic membrane, ear canal and external ear normal.     Left Ear: Tympanic membrane, ear canal and external ear normal.     Nose: No congestion.     Mouth/Throat:     Mouth: Mucous membranes are moist.     Pharynx: Oropharynx is clear. No oropharyngeal exudate or posterior oropharyngeal erythema.  Eyes:     Conjunctiva/sclera: Conjunctivae normal.  Cardiovascular:     Rate and Rhythm: Normal rate and regular rhythm.     Pulses: Normal pulses.     Heart sounds: No murmur heard.   No friction rub. No gallop.  Pulmonary:     Effort: No respiratory distress.     Breath sounds: No wheezing, rhonchi or rales.  Skin:    General: Skin is warm and dry.  Neurological:     Mental Status: She is alert.   Psychiatric:        Mood and Affect: Mood normal.    ED Results / Procedures / Treatments   Labs (all labs ordered are listed, but only abnormal results are displayed) Labs Reviewed - No data to display  EKG None  Radiology DG Chest 2 View  Result Date: 06/17/2021 CLINICAL DATA:  Cough with chest wall pain. EXAM: CHEST - 2 VIEW COMPARISON:  07/06/2019 FINDINGS: The lungs are clear without focal pneumonia, edema, pneumothorax or pleural effusion. The cardiopericardial silhouette is within normal limits for size. The visualized bony structures of the thorax show no acute abnormality.  IMPRESSION: No active cardiopulmonary disease. Electronically Signed   By: Misty Stanley M.D.   On: 06/17/2021 13:54    Procedures Procedures    Medications Ordered in ED Medications - No data to display  ED Course/ Medical Decision Making/ A&P                           Medical Decision Making Amount and/or Complexity of Data Reviewed Radiology: ordered.  Risk Prescription drug management.   This patient presents to the ED for concern of URI-like symptoms, this involves an extensive number of treatment options, and is a complaint that carries with it a high risk of complications and morbidity.  The differential diagnosis includes pneumonia, PE,    Additional history obtained:  Additional history obtained from N/A    Co morbidities that complicate the patient evaluation  N/A  Social Determinants of Health:  N/A    Lab Tests:  I Ordered, and personally interpreted labs.  The pertinent results include: N/A   Imaging Studies ordered:  I ordered imaging studies including chest x-ray I independently visualized and interpreted imaging which showed unremarkable I agree with the radiologist interpretation  EKG-without signs of ischemia    Rule out Low suspicion for systemic infection as patient is nontoxic-appearing, vital signs reassuring, no obvious source infection noted  on exam.  Low suspicion for pneumonia as lung sounds are clear bilaterally, x-ray did not reveal any acute findings.  I have low suspicion for PE as patient denies pleuritic chest pain, shortness of breath,patient is PERC. low suspicion for strep throat as oropharynx was visualized, no erythema or exudates noted.  Low suspicion patient would need  hospitalized due to viral infection or Covid as vital signs reassuring, patient is not in respiratory distress.      Dispostion and problem list  After consideration of the diagnostic results and the patients response to treatment, I feel that the patent would benefit from discharge.   URI-likely viral in nature, will recommend symptom management, will defer on antiviral treatments as she is outside the treatment window, she is not immunocompromise, nontoxic-appearing, very low suspicion for adverse outcome.  Follow-up with PCP as needed.  Gave strict return precautions.            Final Clinical Impression(s) / ED Diagnoses Final diagnoses:  Viral URI with cough    Rx / DC Orders ED Discharge Orders          Ordered    benzonatate (TESSALON) 100 MG capsule  Every 8 hours,   Status:  Discontinued        06/17/21 1816    benzonatate (TESSALON) 100 MG capsule  Every 8 hours        06/17/21 1830              Aron Baba 06/17/21 Diamantina Monks, MD 06/19/21 1743

## 2021-06-17 NOTE — ED Triage Notes (Signed)
Patient reports cough with chest wall pain that has developed in the past week. States that she was seen at [facility] and given meds but symptoms are worsening.

## 2021-09-26 ENCOUNTER — Other Ambulatory Visit: Payer: Self-pay

## 2021-09-26 ENCOUNTER — Encounter (HOSPITAL_COMMUNITY): Payer: Self-pay | Admitting: *Deleted

## 2021-09-26 ENCOUNTER — Emergency Department (HOSPITAL_COMMUNITY)
Admission: EM | Admit: 2021-09-26 | Discharge: 2021-09-26 | Disposition: A | Discharge status: Home / Self Care | Payer: BC Managed Care – PPO | Attending: Emergency Medicine | Admitting: Emergency Medicine

## 2021-09-26 DIAGNOSIS — Z794 Long term (current) use of insulin: Secondary | ICD-10-CM | POA: Insufficient documentation

## 2021-09-26 DIAGNOSIS — H66002 Acute suppurative otitis media without spontaneous rupture of ear drum, left ear: Secondary | ICD-10-CM | POA: Insufficient documentation

## 2021-09-26 DIAGNOSIS — Z7982 Long term (current) use of aspirin: Secondary | ICD-10-CM | POA: Diagnosis not present

## 2021-09-26 DIAGNOSIS — Z7901 Long term (current) use of anticoagulants: Secondary | ICD-10-CM | POA: Insufficient documentation

## 2021-09-26 DIAGNOSIS — H81392 Other peripheral vertigo, left ear: Secondary | ICD-10-CM | POA: Insufficient documentation

## 2021-09-26 DIAGNOSIS — H9202 Otalgia, left ear: Secondary | ICD-10-CM | POA: Diagnosis present

## 2021-09-26 MED ORDER — MECLIZINE HCL 25 MG PO TABS: 25.0000 mg | ORAL_TABLET | Freq: Three times a day (TID) | ORAL | 0 refills | Status: DC | PRN

## 2021-09-26 MED ORDER — AMOXICILLIN-POT CLAVULANATE 875-125 MG PO TABS: 1.0000 | ORAL_TABLET | Freq: Two times a day (BID) | ORAL | 0 refills | Status: DC

## 2021-09-26 MED ORDER — METHYLPREDNISOLONE 4 MG PO TBPK: ORAL_TABLET | ORAL | 0 refills | Status: DC

## 2021-09-26 NOTE — ED Provider Notes (Signed)
[facility] EMERGENCY DEPARTMENT Provider Note   CSN: [id number] Arrival date & time: 09/26/21  1114     History  Chief Complaint  Patient presents with   Otalgia    Sara Pruitt is a 46 y.o. female who presents emergency department with chief complaint of left ear pain and vertigo.  She has a history of recurrent episodes of vertigo.  Patient states that normally she gets some signs or symptoms prior to onset of vertigo however today when she was driving to school she had sudden sharp pain in the left ear followed by room spinning dizziness.  She states that she is able to ambulate normally without balance issues however feels like the room is spinning especially when she looks left.  Patient also notes pain in the left ear radiating down posterior to the left ear.  She states that that is abnormal because normally she gets pain behind her eyes when she has vertigo.  She denies nausea, vomiting, headache, change in vision, unilateral weakness, difficulty with speech or swallowing.  She has been dealing with persistent nasal congestion for several weeks.  She feels this is due to allergies.   Otalgia     Home Medications Prior to Admission medications   Medication Sig Start Date End Date Taking? Authorizing Provider  meclizine (ANTIVERT) 25 MG tablet Take 1 tablet (25 mg total) by mouth 3 (three) times daily as needed for dizziness. 09/26/21  Yes Margarita Mail, PA-C  methylPREDNISolone (MEDROL DOSEPAK) 4 MG TBPK tablet Use as directed 09/26/21  Yes Arelyn Gauer, PA-C  acetaZOLAMIDE (DIAMOX) 500 MG capsule Take 500 mg by mouth daily. Patient not taking: No sig reported 07/13/20   [provider]  amoxicillin-clavulanate (AUGMENTIN) 875-125 MG tablet Take 1 tablet by mouth every 12 (twelve) hours. 09/26/21   Margarita Mail, PA-C  aspirin EC 81 MG tablet Take 81 mg by mouth daily. Swallow whole.    [provider]  benzonatate (TESSALON) 100 MG capsule Take 1  capsule (100 mg total) by mouth every 8 (eight) hours. 06/17/21   Marcello Fennel, PA-C  cetirizine (ZYRTEC ALLERGY) 10 MG tablet Take 1 tablet (10 mg total) by mouth daily. 06/09/21   Volney American, PA-C  EMGALITY 120 MG/ML SOAJ Inject 1 mL into the skin every 30 (thirty) days. 11/27/20   [provider]  Erenumab-aooe (AIMOVIG) 140 MG/ML SOAJ Inject 140 mg as directed every 30 (thirty) days. Once a month Patient not taking: No sig reported    [provider]  fluticasone (FLONASE) 50 MCG/ACT nasal spray Place 1 spray into both nostrils 2 (two) times daily. 06/09/21   Volney American, PA-C  hydrochlorothiazide (HYDRODIURIL) 25 MG tablet Take 12.5 mg by mouth daily.  10/02/17   [provider]  imipramine (TOFRANIL) 10 MG tablet Take 5 mg by mouth at bedtime. Patient not taking: No sig reported    [provider]  ketoconazole (NIZORAL) 2 % shampoo Apply 1 application topically 2 (two) times a week. 03/22/20   [provider]  meloxicam (MOBIC) 15 MG tablet Take 15 mg by mouth daily as needed for pain. Patient not taking: No sig reported    [provider]  Methylcellulose, Laxative, (FIBER THERAPY) 500 MG TABS Take 500 mg by mouth daily.    [provider]  metoprolol tartrate (LOPRESSOR) 25 MG tablet Take 12.5-25 mg by mouth 2 (two) times daily. Patient not taking: No sig reported 10/31/20   [provider]  Multiple Vitamin (MULTIVITAMIN WITH MINERALS) TABS tablet Take 1 tablet by mouth daily.    [provider]  ondansetron (ZOFRAN-ODT) 4 MG disintegrating tablet Take 4 mg by mouth 2 (two) times daily.    [provider]  oxyCODONE (OXY IR/ROXICODONE) 5 MG immediate release tablet Take 5 mg by mouth See admin instructions. Take 1 tablet by mouth every 4 to 6 hours as needed for pain    [provider]  pantoprazole (PROTONIX) 40 MG tablet TAKE (1) TABLET BY MOUTH TWICE DAILY BEFORE A  MEAL. 03/13/21   Mahala Menghini, PA-C  phentermine (ADIPEX-P) 37.5 MG tablet Take 37.5 mg by mouth every morning. Patient not taking: No sig reported 05/23/20   [provider]  predniSONE (DELTASONE) 20 MG tablet Take 2 tablets (40 mg total) by mouth daily with breakfast. 06/09/21   Volney American, PA-C  pregabalin (LYRICA) 75 MG capsule Take 75 mg by mouth 2 (two) times daily.    [provider]  Probiotic Product (PROBIOTIC DAILY PO) Take 1 tablet by mouth daily.     [provider]  RIVAROXABAN [employer] Reasons) VTE STARTER PACK (15 & 20 MG) Follow package directions: Take one '15mg'$  tablet by mouth twice a day. On day 22, switch to one '20mg'$  tablet once a day. Take with food. 12/12/20   Fredia Sorrow, MD  TROKENDI XR 50 MG CP24 Take 1 capsule by mouth daily. 12/01/20   [provider]  vitamin C (ASCORBIC ACID) 500 MG tablet Take 1,000 mg by mouth daily.    [provider]      Allergies    Sulfa antibiotics, Strawberry extract, and Sulfonamide derivatives    Review of Systems   Review of Systems  HENT:  Positive for ear pain.    Physical Exam Updated Vital Signs BP (!) 153/94   Pulse 67   Temp 97.9 F (36.6 C) (Oral)   Ht '5\' 7"'$  (1.702 m)   Wt 83.9 kg   SpO2 99%   BMI 28.98 kg/m  Physical Exam Vitals and nursing note reviewed.  Constitutional:      General: She is not in acute distress.    Appearance: She is well-developed. She is not diaphoretic.  HENT:     Head: Normocephalic and atraumatic.     Right Ear: Tympanic membrane and external ear normal.     Left Ear: External ear normal.     Ears:     Comments: Left TM bulging, erythematous and opaque, unable to see landmarks.  No tenderness with movement of the pinna    Nose: Nose normal.     Mouth/Throat:     Mouth: Mucous membranes are moist.  Eyes:     General: No scleral icterus.    Extraocular Movements: Extraocular movements intact.     Conjunctiva/sclera: Conjunctivae  normal.     Pupils: Pupils are equal, round, and reactive to light.     Comments: Left lateral nystagmus  Cardiovascular:     Rate and Rhythm: Normal rate and regular rhythm.     Heart sounds: Normal heart sounds. No murmur heard.   No friction rub. No gallop.  Pulmonary:     Effort: Pulmonary effort is normal. No respiratory distress.     Breath sounds: Normal breath sounds.  Abdominal:     General: Bowel sounds are normal. There is no distension.     Palpations: Abdomen is soft. There is no mass.     Tenderness: There is no abdominal  tenderness. There is no guarding.  Musculoskeletal:     Cervical back: Normal range of motion.  Skin:    General: Skin is warm and dry.  Neurological:     Mental Status: She is alert and oriented to person, place, and time.     Cranial Nerves: No cranial nerve deficit.     Sensory: No sensory deficit.     Motor: No weakness.     Coordination: Coordination normal.     Gait: Gait normal.     Deep Tendon Reflexes: Reflexes normal.  Psychiatric:        Behavior: Behavior normal.    ED Results / Procedures / Treatments   Labs (all labs ordered are listed, but only abnormal results are displayed) Labs Reviewed - No data to display  EKG None  Radiology No results found.  Procedures Procedures    Medications Ordered in ED Medications - No data to display  ED Course/ Medical Decision Making/ A&P                           Medical Decision Making Patient complains of left ear pain and vertigo.  On evaluation patient's physical exam findings consistent with peripheral vertigo.  She is ambulatory without ataxia, has normal coordination, left lateral nystagmus.  Also patient appears to have ear infection on the left which is likely the cause of both her ear pain and her vertigo.  No signs or symptoms of otitis externa, meningitis, mastoiditis.  I will discharge the patient with Augmentin, Antivert, and Medrol Dosepak to reduce sinus pressure and  swelling.  Will give ENT referral, return precautions and work note.  Problems Addressed: Non-recurrent acute suppurative otitis media of left ear without spontaneous rupture of tympanic membrane: acute illness or injury Peripheral vertigo involving left ear: acute illness or injury           Final Clinical Impression(s) / ED Diagnoses Final diagnoses:  Non-recurrent acute suppurative otitis media of left ear without spontaneous rupture of tympanic membrane  Peripheral vertigo involving left ear    Rx / DC Orders ED Discharge Orders          Ordered    amoxicillin-clavulanate (AUGMENTIN) 875-125 MG tablet  Every 12 hours        09/26/21 1345    methylPREDNISolone (MEDROL DOSEPAK) 4 MG TBPK tablet        09/26/21 1345    meclizine (ANTIVERT) 25 MG tablet  3 times daily PRN        09/26/21 1345              Margarita Mail, PA-C 09/26/21 1346    Godfrey Pick, MD 10/01/21 [phone]

## 2021-09-26 NOTE — ED Triage Notes (Signed)
Pt c/o feeling dizzy and sharp pain to left ear that started today

## 2022-04-17 ENCOUNTER — Encounter: Payer: Self-pay | Admitting: Neurology

## 2022-07-18 ENCOUNTER — Encounter (HOSPITAL_COMMUNITY): Payer: Self-pay | Admitting: Pharmacy Technician

## 2022-07-18 ENCOUNTER — Emergency Department (HOSPITAL_COMMUNITY): Payer: BC Managed Care – PPO

## 2022-07-18 ENCOUNTER — Emergency Department (HOSPITAL_COMMUNITY)
Admission: EM | Admit: 2022-07-18 | Discharge: 2022-07-18 | Disposition: A | Discharge status: Home / Self Care | Payer: BC Managed Care – PPO | Attending: Emergency Medicine | Admitting: Emergency Medicine

## 2022-07-18 ENCOUNTER — Other Ambulatory Visit: Payer: Self-pay

## 2022-07-18 DIAGNOSIS — R0789 Other chest pain: Secondary | ICD-10-CM | POA: Diagnosis not present

## 2022-07-18 DIAGNOSIS — R059 Cough, unspecified: Secondary | ICD-10-CM | POA: Diagnosis present

## 2022-07-18 DIAGNOSIS — Z1152 Encounter for screening for COVID-19: Secondary | ICD-10-CM | POA: Insufficient documentation

## 2022-07-18 DIAGNOSIS — Z7901 Long term (current) use of anticoagulants: Secondary | ICD-10-CM | POA: Diagnosis not present

## 2022-07-18 DIAGNOSIS — I10 Essential (primary) hypertension: Secondary | ICD-10-CM | POA: Diagnosis not present

## 2022-07-18 DIAGNOSIS — R053 Chronic cough: Secondary | ICD-10-CM

## 2022-07-18 DIAGNOSIS — J069 Acute upper respiratory infection, unspecified: Secondary | ICD-10-CM

## 2022-07-18 HISTORY — DX: Acute embolism and thrombosis of unspecified deep veins of unspecified lower extremity: I82.409

## 2022-07-18 LAB — RESP PANEL BY RT-PCR (RSV, FLU A&B, COVID)  RVPGX2
Influenza A by PCR: NEGATIVE
Influenza B by PCR: NEGATIVE
Resp Syncytial Virus by PCR: NEGATIVE
SARS Coronavirus 2 by RT PCR: NEGATIVE

## 2022-07-18 MED ORDER — ONDANSETRON HCL 4 MG PO TABS: 4.0000 mg | ORAL_TABLET | Freq: Four times a day (QID) | ORAL | 0 refills | Status: DC | PRN

## 2022-07-18 MED ORDER — BENZONATATE 100 MG PO CAPS
200.0000 mg | ORAL_CAPSULE | Freq: Once | ORAL | Status: AC
  Administered 2022-07-18: 200 mg via ORAL
  Filled 2022-07-18: qty 2

## 2022-07-18 MED ORDER — ONDANSETRON 8 MG PO TBDP
8.0000 mg | ORAL_TABLET | Freq: Once | ORAL | Status: AC
  Administered 2022-07-18: 8 mg via ORAL
  Filled 2022-07-18: qty 1

## 2022-07-18 MED ORDER — PANTOPRAZOLE SODIUM 40 MG PO TBEC: 40.0000 mg | DELAYED_RELEASE_TABLET | Freq: Every day | ORAL | 1 refills | Status: AC

## 2022-07-18 MED ORDER — BENZONATATE 100 MG PO CAPS: 200.0000 mg | ORAL_CAPSULE | Freq: Three times a day (TID) | ORAL | 0 refills | Status: DC | PRN

## 2022-07-18 MED ORDER — ACETAMINOPHEN 325 MG PO TABS
650.0000 mg | ORAL_TABLET | Freq: Once | ORAL | Status: AC
  Administered 2022-07-18: 650 mg via ORAL
  Filled 2022-07-18: qty 2

## 2022-07-18 NOTE — Discharge Instructions (Addendum)
The workup today was overall reassuring.  You tested negative for COVID, flu, RSV.  Chest x-ray was without abnormality.  Recommend using cough suppressant as needed at home for persistent cough.  Will send in nausea/vomiting medicine in the form of Zofran to take as needed.  I will also continue your reflux medicine in the form of Protonix.  Recommend symptomatic therapy at home with allergy medicine in the form of Zyrtec/Claritin/Allegra, nasal steroid spray in the form of Nasacort/Flonase, Tylenol/Motrin as needed for pain/fever.  Recommend reevaluation by primary care for reassessment of your symptoms.  Please do not hesitate to return to emergency department if the worrisome signs and symptoms we discussed become apparent.

## 2022-07-18 NOTE — ED Provider Notes (Signed)
[place] Provider Note   CSN: [id number] Arrival date & time: 07/18/22  [id number]     History  Chief Complaint  Patient presents with   Cough   Emesis    Sara Pruitt is a 47 y.o. female.   Cough Emesis Associated symptoms: cough     47 year old female presents emergency department with complaints of cough, nasal congestion, sore throat.  Patient states she has been with cough for the past 9 months.  States that she has history of idiopathic intracranial hypertension taking acetazolamide but primary care provider has taken her off of all of her medications including for GERD, hypertension, etc. In favor of trialing FODMAP diet for delineation of patient's history of IBS and exacerbating causes.  Patient reports temperature at home of 99.1 F.  Reports some chest discomfort with associated cough.  Denies abdominal pain, hematemesis, urinary symptoms, change in bowel habits, shortness of breath.  Past medical history significant for idiopathic intracranial hypertension on acetazolamide, migraine, hypertension, GERD, sickle cell trait, IBS  Home Medications Prior to Admission medications   Medication Sig Start Date End Date Taking? Authorizing Provider  benzonatate (TESSALON) 100 MG capsule Take 2 capsules (200 mg total) by mouth 3 (three) times daily as needed for cough. 07/18/22  Yes Dion Saucier A, PA  Multiple Vitamin (MULTIVITAMIN WITH MINERALS) TABS tablet Take 1 tablet by mouth daily.   Yes [provider]  ondansetron (ZOFRAN) 4 MG tablet Take 1 tablet (4 mg total) by mouth every 6 (six) hours as needed for nausea or vomiting. 07/18/22  Yes Dion Saucier A, PA  pantoprazole (PROTONIX) 40 MG tablet Take 1 tablet (40 mg total) by mouth daily. 07/18/22  Yes Dion Saucier A, PA  vitamin C (ASCORBIC ACID) 500 MG tablet Take 1,000 mg by mouth daily.   Yes [provider]  cetirizine (ZYRTEC ALLERGY) 10 MG  tablet Take 1 tablet (10 mg total) by mouth daily. Patient not taking: Reported on 07/18/2022 06/09/21   Volney American, PA-C  fluticasone [facility]) 50 MCG/ACT nasal spray Place 1 spray into both nostrils 2 (two) times daily. Patient not taking: Reported on 07/18/2022 06/09/21   Volney American, PA-C  meclizine (ANTIVERT) 25 MG tablet Take 1 tablet (25 mg total) by mouth 3 (three) times daily as needed for dizziness. Patient not taking: Reported on 07/18/2022 09/26/21   Margarita Mail, PA-C  RIVAROXABAN [employer]veda Re[employer]) VTE STARTER PACK (15 & 20 MG) Follow package directions: Take one 15mg  tablet by mouth twice a day. On day 22, switch to one 20mg  tablet once a day. Take with food. Patient not taking: Reported on 07/18/2022 12/12/20   Fredia Sorrow, MD      Allergies    Sulfa antibiotics, Strawberry extract, and Sulfonamide derivatives    Review of Systems   Review of Systems  Respiratory:  Positive for cough.   Gastrointestinal:  Positive for vomiting.  All other systems reviewed and are negative.   Physical Exam Updated Vital Signs BP 138/81   Pulse 94   Temp 99.9 F (37.7 C) (Axillary)   Resp 16   Ht 5\' 7"  (1.702 m)   Wt 83.9 kg   SpO2 98%   BMI 28.97 kg/m  Physical Exam Vitals and nursing note reviewed.  Constitutional:      General: She is not in acute distress.    Appearance: She is well-developed.  HENT:     Head: Normocephalic and atraumatic.  Right Ear: Tympanic membrane, ear canal and external ear normal.     Left Ear: Tympanic membrane, ear canal and external ear normal.     Nose: Congestion and rhinorrhea present.     Mouth/Throat:     Comments: Mild posterior pharyngeal erythema.  Uvula midline with symmetrical phonation.  No sublingual or submandibular swelling appreciated.  Tonsils are 1+ bilaterally with no obvious exudate. Eyes:     Conjunctiva/sclera: Conjunctivae normal.  Cardiovascular:     Rate and Rhythm: Normal rate and regular rhythm.      Heart sounds: No murmur heard. Pulmonary:     Effort: Pulmonary effort is normal. No respiratory distress.     Breath sounds: Normal breath sounds. No stridor. No wheezing, rhonchi or rales.     Comments: Anterior chest wall tenderness mid sternal Chest:     Chest wall: Tenderness present.  Abdominal:     Palpations: Abdomen is soft.     Tenderness: There is no abdominal tenderness. There is no right CVA tenderness, left CVA tenderness or guarding.  Musculoskeletal:        General: No swelling.     Cervical back: Neck supple.     Right lower leg: No edema.     Left lower leg: No edema.  Skin:    General: Skin is warm and dry.     Capillary Refill: Capillary refill takes less than 2 seconds.  Neurological:     Mental Status: She is alert.  Psychiatric:        Mood and Affect: Mood normal.     ED Results / Procedures / Treatments   Labs (all labs ordered are listed, but only abnormal results are displayed) Labs Reviewed  RESP PANEL BY RT-PCR (RSV, FLU A&B, COVID)  [id number]    EKG None  Radiology DG Chest 2 View  Result Date: 07/18/2022 CLINICAL DATA:  Cough EXAM: CHEST - 2 VIEW COMPARISON:  Chest x-ray dated June 17, 2021 FINDINGS: The heart size and mediastinal contours are within normal limits. Both lungs are clear. The visualized skeletal structures are unremarkable. IMPRESSION: No active cardiopulmonary disease. Electronically Signed   By: Yetta Glassman M.D.   On: 07/18/2022 10:10    Procedures Procedures    Medications Ordered in ED Medications  ondansetron (ZOFRAN-ODT) disintegrating tablet 8 mg (8 mg Oral Given 07/18/22 1041)  acetaminophen (TYLENOL) tablet 650 mg (650 mg Oral Given 07/18/22 1041)  benzonatate (TESSALON) capsule 200 mg (200 mg Oral Given 07/18/22 1041)    ED Course/ Medical Decision Making/ A&P                             Medical Decision Making Amount and/or Complexity of Data Reviewed Labs: ordered. Radiology: ordered.  Risk OTC  drugs. Prescription drug management.   This patient presents to the ED for concern of cough, this involves an extensive number of treatment options, and is a complaint that carries with it a high risk of complications and morbidity.  The differential diagnosis includes influenza, COVID, RSV, pneumonia, GERD, malignancy, PE   Co morbidities that complicate the patient evaluation  See HPI   Additional history obtained:  Additional history obtained from EMR External records from outside source obtained and reviewed including hospital records   Lab Tests:  I Ordered, and personally interpreted labs.  The pertinent results include: Respiratory viral panel negative   Imaging Studies ordered:  I ordered imaging studies including chest x-ray I  independently visualized and interpreted imaging which showed cardiopulmonary abnormality I agree with the radiologist interpretation   Cardiac Monitoring: / EKG:  The patient was maintained on a cardiac monitor.  I personally viewed and interpreted the cardiac monitored which showed an underlying rhythm of: Sinus rhythm   Consultations Obtained:  N/a   Problem List / ED Course / Critical interventions / Medication management  Cough, viral URI I ordered medication including Tylenol, benzonatate, Zofran   Reevaluation of the patient after these medicines showed that the patient improved I have reviewed the patients home medicines and have made adjustments as needed   Social Determinants of Health:  Denies tobacco, illicit drug use   Test / Admission - Considered:  Cough, viral URI Vitals signs within normal range and stable throughout visit. Laboratory/imaging studies significant for: See above 47 year old female presents emergency department with symptoms consistent with viral URI as well as with persistent cough.  Chest x-ray without signs of consolidation/infiltration.  Patient tested negative for COVID, influenza, RSV.   Patient with history of GERD without PPI/H2 blocker therapy.  Persistent cough could be secondary to GERD.  Shared decision-making conversation was had with patient regarding obtaining CT imaging of her chest given chronic cough with patient declined at this time for outpatient management via medical therapy of reflux medication as well as cough suppressant.  Additional therapy recommended in the form of antihistamine, nasal steroid spray, Tylenol/Motrin as needed for pain/fever.  Patient recommended follow-up with primary care for reassessment of symptoms.  Treatment plan discussed at length with patient and she acknowledged understanding was agreeable to said plan. Worrisome signs and symptoms were discussed with the patient, and the patient acknowledged understanding to return to the ED if noticed. Patient was stable upon discharge.          Final Clinical Impression(s) / ED Diagnoses Final diagnoses:  Chronic cough  Viral upper respiratory tract infection    Rx / DC Orders ED Discharge Orders          Ordered    ondansetron (ZOFRAN) 4 MG tablet  Every 6 hours PRN        07/18/22 1140    benzonatate (TESSALON) 100 MG capsule  3 times daily PRN        07/18/22 1140    pantoprazole (PROTONIX) 40 MG tablet  Daily        07/18/22 1141              Wilnette Kales, [place] 07/18/22 1310    Milton Ferguson, MD 07/19/22 1812

## 2022-07-18 NOTE — ED Triage Notes (Signed)
Pt here POV with reports of worsening cough for the last 4 days. Also endorses low grade fever and emesis onset today.

## 2022-08-27 ENCOUNTER — Other Ambulatory Visit: Payer: Self-pay | Admitting: Nurse Practitioner

## 2022-08-27 DIAGNOSIS — Z1231 Encounter for screening mammogram for malignant neoplasm of breast: Secondary | ICD-10-CM
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
## 2022-09-10 ENCOUNTER — Telehealth: Payer: Self-pay

## 2022-09-10 ENCOUNTER — Ambulatory Visit: Payer: BC Managed Care – PPO | Admitting: Neurology

## 2022-09-10 ENCOUNTER — Encounter: Payer: Self-pay | Admitting: Neurology

## 2022-09-10 VITALS — BP 135/77 | HR 80 | Ht 67.0 in | Wt 198.0 lb

## 2022-09-10 DIAGNOSIS — Z8669 Personal history of other diseases of the nervous system and sense organs: Secondary | ICD-10-CM | POA: Diagnosis not present

## 2022-09-10 DIAGNOSIS — G43709 Chronic migraine without aura, not intractable, without status migrainosus: Secondary | ICD-10-CM

## 2022-09-10 NOTE — Patient Instructions (Signed)
  Have eye doctor send notes to me.  Let me know after you see them Continue acetazolamide for now. Plan to start Botox Take Ubrelvy at earliest onset of headache.  May repeat dose once in 2 hours if needed.  Maximum 2 tablets in 24 hours. - let me know how it works for you Limit use of pain relievers to no more than 2 days out of the week.  These medications include acetaminophen, NSAIDs (ibuprofen/Advil/Motrin, naproxen/Aleve, triptans (Imitrex/sumatriptan), Excedrin, and narcotics.  This will help reduce risk of rebound headaches. Routine exercise Stay adequately hydrated (aim for 64 oz water daily) Keep headache diary Maintain proper stress management Maintain proper sleep hygiene Do not skip meals

## 2022-09-10 NOTE — Telephone Encounter (Addendum)
Patient seen in office today. Per Dr.Jaffe patient to start Botox 200 units.  PA team please start a PA for Botox 200 units every 90 days.

## 2022-09-15 ENCOUNTER — Telehealth: Payer: Self-pay | Admitting: Pharmacy Technician

## 2022-09-15 ENCOUNTER — Other Ambulatory Visit (HOSPITAL_COMMUNITY): Payer: Self-pay

## 2022-09-15 NOTE — Telephone Encounter (Signed)
BotoxOne verification has been submitted. Benefit Verification:  [id number]  Pharmacy PA has been submitted for BOTOX 200U via CMM. INSURANCE: [employer] DATE SUBMITTED: 5.19.22 KEY: [id number]  INSURANCE: [employer] DATE SUBMITTED: 5.19.22 KEY: [id number]  Status is pending

## 2022-09-15 NOTE — Telephone Encounter (Signed)
PA has been submitted EXPEDITED, and telephone encounter has been created.  

## 2022-09-16 NOTE — Telephone Encounter (Signed)
PA has been DENIED:   Per your health plan's criteria, this drug is covered if you meet the following: One of the following: (A) You have failed a three month trial of one agent from two of the following (failure defined as an inability to reduce migraine headaches by two or more days per month): (I) Anticonvulsant: topiramate, divalpreox sodium. (II) Antidepressant: venlafaxine, amitriptyline. (III) Beta blocker: atenolol, metoprolol. (B) Your doctor tells [place] that you take your drugs as prescribed unless you cannot use them.

## 2022-09-17 NOTE — Telephone Encounter (Signed)
  PA has been DENIED:    Per your health plan's criteria, this drug is covered if you meet the following: One of the following: (A) You have failed a three month trial of one agent from two of the following (failure defined as an inability to reduce migraine headaches by two or more days per month): (I) Anticonvulsant: topiramate, divalpreox sodium. (II) Antidepressant: venlafaxine, amitriptyline. (III) Beta blocker: atenolol, metoprolol. (B) Your doctor tells [place] that you take your drugs as prescribed unless you cannot use them.

## 2022-09-19 ENCOUNTER — Other Ambulatory Visit (HOSPITAL_COMMUNITY): Payer: Self-pay

## 2022-09-19 NOTE — Telephone Encounter (Signed)
APPEAL has been submitted with additional documentation, that patient has tried/failed topirimate and metoprolol greater than 3 months as stated in denial letter. Appeal has been faxed to:

## 2022-09-26 ENCOUNTER — Encounter: Payer: Self-pay | Admitting: Neurology

## 2022-09-28 ENCOUNTER — Telehealth: Payer: Self-pay | Admitting: Pharmacy Technician

## 2022-09-28 DIAGNOSIS — G43709 Chronic migraine without aura, not intractable, without status migrainosus: Secondary | ICD-10-CM

## 2022-09-28 NOTE — Telephone Encounter (Signed)
Patient Advocate Encounter  Received notification from [facility] that prior authorization for BOTOX 200U is required.   PA submitted on 6.1.24 Key [id number] Status is pending

## 2022-10-02 NOTE — Telephone Encounter (Signed)
Patient Advocate Encounter  Received notification from [facility] that prior authorization for BOTOX 200U is required.   PA submitted on 6.5.24 Key [id number] Status is pending

## 2022-10-03 ENCOUNTER — Other Ambulatory Visit: Payer: Self-pay

## 2022-10-03 ENCOUNTER — Other Ambulatory Visit (HOSPITAL_COMMUNITY): Payer: Self-pay

## 2022-10-03 MED ORDER — ONABOTULINUMTOXINA 200 UNITS IJ SOLR
INTRAMUSCULAR | 4 refills | Status: DC
  Filled 2022-10-03: qty 1, fill #0
  Filled 2022-10-03: qty 1, 84d supply, fill #0
  Filled 2023-01-01: qty 1, 84d supply, fill #1
  Filled 2023-04-02 – 2023-04-09 (×2): qty 1, 84d supply, fill #2
  Filled 2023-07-04: qty 1, 34d supply, fill #3
  Filled 2023-10-03: qty 1, 34d supply, fill #4

## 2022-10-03 NOTE — Telephone Encounter (Signed)
Patient advised, Appt scheduled for 10/18/22 at 10:30.  Script sent to [facility].

## 2022-10-03 NOTE — Telephone Encounter (Signed)
Pharmacy Patient Advocate Encounter- Botox BIV-Pharmacy Benefit:  PA was submitted to [facility] and has been approved through: 6.5.24 to 12.5.24 Authorization#  [id number]  Please send prescription to Specialty Pharmacy: [facility]: [phone]  Estimated Copay is: $365.18  Patient IS eligible for Botox Copay Card, which will make patient's copay as little as $0. Copay card will be provided to pharmacy.

## 2022-10-08 ENCOUNTER — Other Ambulatory Visit (HOSPITAL_COMMUNITY): Payer: Self-pay

## 2022-10-11 ENCOUNTER — Other Ambulatory Visit (HOSPITAL_COMMUNITY): Payer: Self-pay

## 2022-10-18 ENCOUNTER — Ambulatory Visit (INDEPENDENT_AMBULATORY_CARE_PROVIDER_SITE_OTHER): Payer: BC Managed Care – PPO | Admitting: Neurology

## 2022-10-18 DIAGNOSIS — G43709 Chronic migraine without aura, not intractable, without status migrainosus: Secondary | ICD-10-CM

## 2022-10-18 MED ORDER — ONABOTULINUMTOXINA 100 UNITS IJ SOLR
200.0000 [IU] | Freq: Once | INTRAMUSCULAR | Status: AC
  Administered 2022-10-18: 155 [IU] via INTRAMUSCULAR

## 2022-10-18 NOTE — Progress Notes (Signed)
[facility]  ° °Procedure Note Botox ° °Attending: Dr. Reine Bristow ° °Preoperative Diagnosis(es): Chronic migraine ° °Consent obtained from: The patient °Benefits discussed included, but were not limited to decreased muscle tightness, increased joint range of motion, and decreased pain.  Risk discussed included, but were not limited pain and discomfort, bleeding, bruising, excessive weakness, venous thrombosis, muscle atrophy and dysphagia.  Anticipated outcomes of the procedure as well as he risks and benefits of the alternatives to the procedure, and the roles and tasks of the personnel to be involved, were discussed with the patient, and the patient consents to the procedure and agrees to proceed. A copy of the patient medication guide was given to the patient which explains the blackbox warning. ° °Patients identity and treatment sites confirmed Yes.  . ° °Details of Procedure: °Skin was cleaned with alcohol. Prior to injection, the needle plunger was aspirated to make sure the needle was not within a blood vessel.  There was no blood retrieved on aspiration.   ° °Following is a summary of the muscles injected  And the amount of Botulinum toxin used: ° °Dilution °200 units of Botox was reconstituted with 4 ml of preservative free normal saline. °Time of reconstitution: At the time of the office visit (<30 minutes prior to injection)  ° °Injections  °155 total units of Botox was injected with a 30 gauge needle. ° °Injection Sites: °L occipitalis: 15 units- 3 sites  °R occiptalis: 15 units- 3 sites ° °L upper trapezius: 15 units- 3 sites °R upper trapezius: 15 units- 3 sits          °L paraspinal: 10 units- 2 sites °R paraspinal: 10 units- 2 sites ° °Face °L frontalis(2 injection sites):10 units   °R frontalis(2 injection sites):10 units         °L corrugator: 5 units   °R corrugator: 5 units           °Procerus: 5 units   °L temporalis: 20 units °R temporalis: 20 units  ° °Agent:  °200 units of botulinum Type  A (Onobotulinum Toxin type A) was reconstituted with 4 ml of preservative free normal saline.  °Time of reconstitution: At the time of the office visit (<30 minutes prior to injection)  ° ° ° Total injected (Units):  155 ° Total wasted (Units):  45 ° °Patient tolerated procedure well without complications.   °Reinjection is anticipated in 3 months. ° ° °

## 2023-01-01 ENCOUNTER — Other Ambulatory Visit (HOSPITAL_COMMUNITY): Payer: Self-pay

## 2023-01-17 ENCOUNTER — Ambulatory Visit (INDEPENDENT_AMBULATORY_CARE_PROVIDER_SITE_OTHER): Payer: BC Managed Care – PPO | Admitting: Neurology

## 2023-01-17 DIAGNOSIS — G43709 Chronic migraine without aura, not intractable, without status migrainosus: Secondary | ICD-10-CM | POA: Diagnosis not present

## 2023-01-17 MED ORDER — ONABOTULINUMTOXINA 100 UNITS IJ SOLR
200.0000 [IU] | Freq: Once | INTRAMUSCULAR | Status: AC
  Administered 2023-01-17: 155 [IU] via INTRAMUSCULAR

## 2023-01-17 NOTE — Progress Notes (Signed)

## 2023-01-25 ENCOUNTER — Emergency Department (HOSPITAL_COMMUNITY): Payer: BC Managed Care – PPO

## 2023-01-25 ENCOUNTER — Encounter (HOSPITAL_COMMUNITY): Payer: Self-pay

## 2023-01-25 ENCOUNTER — Emergency Department (HOSPITAL_COMMUNITY)
Admission: EM | Admit: 2023-01-25 | Discharge: 2023-01-26 | Disposition: A | Discharge status: Home / Self Care | Payer: BC Managed Care – PPO | Attending: Emergency Medicine | Admitting: Emergency Medicine

## 2023-01-25 ENCOUNTER — Other Ambulatory Visit: Payer: Self-pay

## 2023-01-25 DIAGNOSIS — M25561 Pain in right knee: Secondary | ICD-10-CM
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
## 2023-02-03 ENCOUNTER — Telehealth: Payer: Self-pay | Admitting: Orthopedic Surgery

## 2023-02-03 NOTE — Telephone Encounter (Signed)
Returned the patient's call, she was seen in the ED at [facility] on 9/28 for her right knee.  She has [employer], she is going to try to find a provider within the [employer] so her copay will be less.  If she needs [place] she'll call bacl.
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
## 2023-03-20 ENCOUNTER — Encounter: Payer: Self-pay | Admitting: Neurology

## 2023-03-20 ENCOUNTER — Ambulatory Visit (INDEPENDENT_AMBULATORY_CARE_PROVIDER_SITE_OTHER): Payer: BC Managed Care – PPO | Admitting: Neurology

## 2023-03-20 ENCOUNTER — Telehealth: Payer: Self-pay

## 2023-03-20 VITALS — BP 134/87 | HR 70 | Ht 67.0 in | Wt 200.8 lb

## 2023-03-20 DIAGNOSIS — G43709 Chronic migraine without aura, not intractable, without status migrainosus: Secondary | ICD-10-CM | POA: Diagnosis not present

## 2023-03-20 DIAGNOSIS — Z8669 Personal history of other diseases of the nervous system and sense organs: Secondary | ICD-10-CM

## 2023-03-20 MED ORDER — UBRELVY 100 MG PO TABS: 1.0000 | ORAL_TABLET | ORAL | 11 refills | Status: DC | PRN

## 2023-03-20 NOTE — Patient Instructions (Signed)
Continue Botox Ubrelvy as needed/directed for headaches Get a repeat eye exam and have them send their notes to me.  Send me a mychart message letting me know you had the exam. Continue to stay off acetazolamide Follow up 6 months.

## 2023-03-20 NOTE — Telephone Encounter (Signed)
Patient seen in office today, Per Dr.Jaffe continue  botox.    PA expires 04/03/23 appt 04/18/2023

## 2023-04-02 ENCOUNTER — Other Ambulatory Visit: Payer: Self-pay

## 2023-04-02 ENCOUNTER — Telehealth: Payer: Self-pay | Admitting: Pharmacy Technician

## 2023-04-02 NOTE — Telephone Encounter (Signed)
Pharmacy Patient Advocate Encounter   Received notification from CoverMyMeds that prior authorization for UBRELVY 100MG  is required/requested.   Insurance verification completed.   The patient is insured through [facility] .   Per test claim: PA required; PA submitted to above mentioned insurance via CoverMyMeds Key/confirmation #/EOC [id number] Status is pending

## 2023-04-02 NOTE — Progress Notes (Signed)
Specialty Pharmacy Refill Coordination Note  Sara Pruitt is a 47 y.o. female contacted today regarding refills of specialty medication(s) Onabotulinumtoxina   Patient requested Courier to Provider Office   Delivery date: 04/14/23   Verified address: [facility] [address] [place] [address]   Medication will be filled on 04/11/23.

## 2023-04-02 NOTE — Telephone Encounter (Signed)
Pharmacy Patient Advocate Encounter   Received notification from Fax that prior authorization for BOTOX 200 is required/requested.   Insurance verification completed.   The patient is insured through [facility] .   Per test claim: PA required; PA submitted to above mentioned insurance via Fax Key/confirmation #/EOC [id number] Status is pending

## 2023-04-03 ENCOUNTER — Other Ambulatory Visit (HOSPITAL_COMMUNITY): Payer: Self-pay

## 2023-04-03 NOTE — Telephone Encounter (Signed)
Pharmacy Patient Advocate Encounter  Received notification from [facility] that Prior Authorization for Ubrelvy 100mg  tablets have been APPROVED from 04/02/23 to 04/01/24 Filled at [facility] Today  PA #/Case ID/Reference #: PA Case ID #: [id number]

## 2023-04-09 ENCOUNTER — Other Ambulatory Visit (HOSPITAL_COMMUNITY): Payer: Self-pay

## 2023-04-09 ENCOUNTER — Other Ambulatory Visit: Payer: Self-pay

## 2023-04-11 ENCOUNTER — Other Ambulatory Visit (HOSPITAL_COMMUNITY): Payer: Self-pay

## 2023-04-11 NOTE — Telephone Encounter (Signed)
Pharmacy Patient Advocate Encounter- Botox BIV-Pharmacy Benefit:  PA was submitted to [facility] and has been approved through: 12.4.24 TO 12.4.25 Authorization#   Please send prescription to Specialty Pharmacy: [facility]: [phone]  Estimated Copay is: 0  Patient IS eligible for Botox Copay Card, which will make patient's copay as little as zero. Copay card will be provided to pharmacy.

## 2023-04-17 ENCOUNTER — Encounter: Payer: Self-pay | Admitting: Neurology

## 2023-04-18 ENCOUNTER — Ambulatory Visit (INDEPENDENT_AMBULATORY_CARE_PROVIDER_SITE_OTHER): Payer: BC Managed Care – PPO | Admitting: Neurology

## 2023-04-18 DIAGNOSIS — G43709 Chronic migraine without aura, not intractable, without status migrainosus: Secondary | ICD-10-CM

## 2023-04-18 MED ORDER — ONABOTULINUMTOXINA 100 UNITS IJ SOLR
200.0000 [IU] | Freq: Once | INTRAMUSCULAR | Status: AC
  Administered 2023-04-18: 155 [IU] via INTRAMUSCULAR

## 2023-04-18 NOTE — Progress Notes (Signed)

## 2023-07-03 ENCOUNTER — Telehealth: Payer: Self-pay | Admitting: Neurology

## 2023-07-03 NOTE — Telephone Encounter (Signed)
 Patient needs to make sure that we got her botox approved through  [facility]  she no longer has [employer]   her appt is 07-18-23

## 2023-07-04 ENCOUNTER — Other Ambulatory Visit: Payer: Self-pay

## 2023-07-04 NOTE — Progress Notes (Signed)
 Specialty Pharmacy Refill Coordination Note  Sara Pruitt is a 48 y.o. female contacted today regarding refills of specialty medication(s) OnabotulinumtoxinA (BOTOX)   Patient requested Courier to Provider Office   Delivery date: 07/14/23   Verified address: [facility] [address] [place] [address]   Medication will be filled on 07/11/23.

## 2023-07-11 ENCOUNTER — Other Ambulatory Visit: Payer: Self-pay

## 2023-07-11 ENCOUNTER — Other Ambulatory Visit (HOSPITAL_COMMUNITY): Payer: Self-pay

## 2023-07-11 ENCOUNTER — Telehealth: Payer: Self-pay | Admitting: Pharmacy Technician

## 2023-07-11 NOTE — Telephone Encounter (Signed)
 Pharmacy Patient Advocate Encounter   Received notification from Patient Pharmacy that prior authorization for BOTOX 200 is required/requested.   Insurance verification completed.   The patient is insured through [facility] MEDICAID .   Per test claim: PA required; PA submitted to above mentioned insurance via CoverMyMeds Key/confirmation #/EOC [id number] Status is pending

## 2023-07-14 ENCOUNTER — Other Ambulatory Visit: Payer: Self-pay

## 2023-07-14 ENCOUNTER — Other Ambulatory Visit (HOSPITAL_COMMUNITY): Payer: Self-pay

## 2023-07-15 ENCOUNTER — Other Ambulatory Visit (HOSPITAL_COMMUNITY): Payer: Self-pay

## 2023-07-15 NOTE — Telephone Encounter (Signed)
 Pharmacy Patient Advocate Encounter  PA was submitted to [facility] MEDICAID and has been approved through: 3.14.25 TO 3.14.26 Authorization#  [id number]  Please send prescription to Specialty Pharmacy: [facility]: [phone]  Estimated Copay is: $4

## 2023-07-16 ENCOUNTER — Telehealth: Payer: Self-pay | Admitting: Neurology

## 2023-07-16 NOTE — Telephone Encounter (Signed)
 Pt called in stating she got a headache starting Monday around noon and she cannot get rid of it. She has already taken her Sara Pruitt and it is not working. She isn't sure what to do between now and her Botox appointment on Friday?

## 2023-07-16 NOTE — Telephone Encounter (Signed)
 Patient did try a second Ubrelvy after we spoke and did not help.    Patient would like to see if she could get a Prednisone taper.

## 2023-07-16 NOTE — Telephone Encounter (Signed)
 How frequent or the headaches (on average, how many days a week/month are they occurring)?  Couple of days How long do the headaches last?  3 days Verify what preventative medication and dose you are taking (e.g. topiramate, propranolol, amitriptyline, Emgality, etc)  Botox appt 07/16/23 Verify which rescue medication you are taking (triptan, Advil, Excedrin, Aleve, Bernita Raisin, etc)  Ubrelvy  How often are you taking pain relievers/analgesics/rescue mediction?  NONE  Since going into work today after trying the [place] and getting some relief. The pain is worse with all the noise.

## 2023-07-17 ENCOUNTER — Other Ambulatory Visit: Payer: Self-pay | Admitting: Neurology

## 2023-07-17 MED ORDER — PREDNISONE 10 MG PO TABS: ORAL_TABLET | ORAL | 0 refills | Status: DC

## 2023-07-18 ENCOUNTER — Ambulatory Visit: Payer: BC Managed Care – PPO | Admitting: Neurology

## 2023-07-18 DIAGNOSIS — G43709 Chronic migraine without aura, not intractable, without status migrainosus: Secondary | ICD-10-CM | POA: Diagnosis not present

## 2023-07-18 MED ORDER — ONABOTULINUMTOXINA 100 UNITS IJ SOLR
200.0000 [IU] | Freq: Once | INTRAMUSCULAR | Status: AC
  Administered 2023-07-18: 155 [IU] via INTRAMUSCULAR

## 2023-07-18 NOTE — Progress Notes (Signed)

## 2023-08-27 ENCOUNTER — Emergency Department (HOSPITAL_COMMUNITY)
Admission: EM | Admit: 2023-08-27 | Discharge: 2023-08-28 | Disposition: A | Discharge status: Home / Self Care | Attending: Emergency Medicine | Admitting: Emergency Medicine

## 2023-08-27 ENCOUNTER — Encounter (HOSPITAL_COMMUNITY): Payer: Self-pay

## 2023-08-27 ENCOUNTER — Other Ambulatory Visit: Payer: Self-pay

## 2023-08-27 DIAGNOSIS — M79602 Pain in left arm: Secondary | ICD-10-CM

## 2023-08-27 DIAGNOSIS — X58XXXA Exposure to other specified factors, initial encounter: Secondary | ICD-10-CM | POA: Diagnosis not present

## 2023-08-27 DIAGNOSIS — S4992XA Unspecified injury of left shoulder and upper arm, initial encounter: Secondary | ICD-10-CM | POA: Diagnosis present

## 2023-08-27 DIAGNOSIS — I10 Essential (primary) hypertension: Secondary | ICD-10-CM | POA: Diagnosis not present

## 2023-08-27 DIAGNOSIS — S46812A Strain of other muscles, fascia and tendons at shoulder and upper arm level, left arm, initial encounter: Secondary | ICD-10-CM | POA: Diagnosis not present

## 2023-08-27 LAB — BASIC METABOLIC PANEL WITH GFR
Anion gap: 6 (ref 5–15)
BUN: 15 mg/dL (ref 6–20)
CO2: 25 mmol/L (ref 22–32)
Calcium: 8.7 mg/dL — ABNORMAL LOW (ref 8.9–10.3)
Chloride: 106 mmol/L (ref 98–111)
Creatinine, Ser: 1.04 mg/dL — ABNORMAL HIGH (ref 0.44–1.00)
GFR, Estimated: >60 mL/min (ref >60)
Glucose, Bld: 90 mg/dL (ref 70–99)
Potassium: 3.5 mmol/L (ref 3.5–5.1)
Sodium: 137 mmol/L (ref 135–145)

## 2023-08-27 LAB — CK: Total CK: 178 U/L (ref 38–234)

## 2023-08-27 MED ORDER — METHOCARBAMOL 500 MG PO TABS
750.0000 mg | ORAL_TABLET | Freq: Once | ORAL | Status: AC
  Administered 2023-08-27: 750 mg via ORAL
  Filled 2023-08-27: qty 2

## 2023-08-27 NOTE — ED Provider Notes (Signed)
 [facility] DEPART[facility] AT [facility] Provider Note   CSN: [id number] Arrival date & time: 08/27/23  1748     History  Chief Complaint  Patient presents with   Extremity Pain    Sara Pruitt is a 48 y.o. female with history including GERD, hypertension, sickle cell trait, migraine headaches, IBS, and history of DVT right calf after ankle fracture, not currently anticoagulated presenting for evaluation of left arm pain and swelling described as severe pain with movement and palpation of her left bicep muscle, radiated into her left shoulder, now also involving the neck and has stabs of pain that shoots across to her right scapula as well.  Pain is worsened with movement of the left elbow, shoulder and describes a pulling sensation with rightward neck rotation. She denies midline neck pain or injury.   She is quite active, including lifts weights several times a week with a workout program but does not believe this was the trigger.  However this pain started when waking approximately 2 weeks ago.  She was laying on her left side, initially had tingling sensation in her arm, but this resolved but the pain has escalated.  She also feels more swollen in her left upper arm in comparison to the right.  She has no symptoms distal to the elbow.  The history is provided by the patient.       Home Medications Prior to Admission medications   Medication Sig Start Date End Date Taking? Authorizing Provider  methocarbamol  (ROBAXIN ) 750 MG tablet Take 1 tablet (750 mg total) by mouth 2 (two) times daily. 08/28/23  Yes Keahi Mccarney, Concha Deed, PA-C  acetaminophen  (TYLENOL  8 HOUR) 650 MG CR tablet     [provider]  acetaZOLAMIDE ER (DIAMOX) 500 MG capsule Take 500 mg by mouth 2 (two) times daily. 09/04/22   [provider]  albuterol (PROVENTIL) 2 MG tablet Take 2 mg by mouth 3 (three) times daily. 11/28/22   [provider]  albuterol (VENTOLIN HFA) 108 (90 Base)  MCG/ACT inhaler Inhale 2 puffs into the lungs every 4 (four) hours as needed. 07/30/22   [provider]  Bacillus Coagulans-Inulin (PROBIOTIC) 1-250 BILLION-MG CAPS     [provider]  botulinum toxin Type A  (BOTOX ) 200 units injection Inject 155 units IM into multiple site in the face,neck and head once every 90 days 10/03/22   Merriam Abbey, DO  cetirizine  (ZYRTEC  ALLERGY) 10 MG tablet Take 1 tablet (10 mg total) by mouth daily. 06/09/21   Corbin Dess, PA-C  clotrimazole-betamethasone (LOTRISONE) cream Apply 1 Application topically 2 (two) times daily. 10/21/22   [provider]  griseofulvin (GRIFULVIN V) 500 MG tablet Take 500 mg by mouth daily. Patient not taking: Reported on 03/20/2023 12/17/22   [provider]  methylPREDNISolone  (MEDROL ) 2 MG tablet Take 1 dose pk by oral route. Patient not taking: Reported on 03/20/2023    [provider]  Misc Natural Products (FIBER 7 PO)     [provider]  Multiple Vitamin (MULTIVITAMIN WITH MINERALS) TABS tablet Take 1 tablet by mouth daily.    [provider]  ondansetron  (ZOFRAN ) 4 MG tablet Take 1 tablet (4 mg total) by mouth every 6 (six) hours as needed for nausea or vomiting. 07/18/22   Albertson Butter, PA  pantoprazole  (PROTONIX ) 40 MG tablet Take 1 tablet (40 mg total) by mouth daily. 07/18/22   Palmetto Butter, PA  predniSONE  (DELTASONE ) 10 MG tablet  Take 60mg  on day 1, then 50mg  on day 2, then 40mg  on day 3, then 30mg  on day 4, then 20mg  on day 5, then 10mg  on day 6. 07/17/23   Merriam Abbey, DO  Ubrogepant  (UBRELVY ) 100 MG TABS Take 1 tablet (100 mg total) by mouth as needed. May repeat after 2 hours.  Maximum 2 tablets in 24 hours. 03/20/23   Merriam Abbey, DO  vitamin C (ASCORBIC ACID) 500 MG tablet Take 1,000 mg by mouth daily.    [provider]      Allergies    Sulfa antibiotics, Strawberry extract, and Sulfonamide derivatives    Review of Systems    Review of Systems  Constitutional:  Negative for fever.  Musculoskeletal:  Positive for arthralgias and myalgias. Negative for joint swelling, neck pain and neck stiffness.  Neurological:  Negative for weakness and numbness.    Physical Exam Updated Vital Signs BP (!) 137/96   Pulse 62   Temp 98.2 F (36.8 C) (Oral)   Resp 18   Ht 5\' 7"  (1.702 m)   Wt 85.8 kg   SpO2 100%   BMI 29.63 kg/m  Physical Exam Constitutional:      Appearance: She is well-developed.  HENT:     Head: Atraumatic.  Cardiovascular:     Pulses:          Radial pulses are 2+ on the right side and 2+ on the left side.     Comments: Pulses equal bilaterally Musculoskeletal:        General: Swelling and tenderness present.     Left upper arm: Swelling and tenderness present.       Arms:     Cervical back: Normal range of motion.     Comments: Ttp left upper medial arm along the bicep muscle, less tender posterior arm.  Forearm and hand nontender.  Also ttp left trapezius distribution left soft tissue neck,  upper shoulder with radiation to upper left scapula.  Spasm appreciated. Left upper arm is slightly enlarged in comparison to the right, forearms equal.   Skin:    General: Skin is warm and dry.  Neurological:     Mental Status: She is alert.     Sensory: No sensory deficit.     Motor: No weakness.     Deep Tendon Reflexes: Reflexes normal.     ED Results / Procedures / Treatments   Labs (all labs ordered are listed, but only abnormal results are displayed) Labs Reviewed  BASIC METABOLIC PANEL WITH GFR - Abnormal; Notable for the following components:      Result Value   Creatinine, Ser 1.04 (*)    Calcium 8.7 (*)    All other components within normal limits  CK  D-DIMER, QUANTITATIVE    EKG None  Radiology No results found.  Procedures Procedures    Medications Ordered in ED Medications  methocarbamol  (ROBAXIN ) tablet 750 mg (750 mg Oral Given 08/27/23 2325)    ED Course/  Medical Decision Making/ A&P                                 Medical Decision Making Exam and hx suggesting musculoskeletal strain with trapezius muscle spasm, perhaps involving the left bicep as well.  Robaxin  given.  With hx of dvt,  d dimer  ordered to rule out new dvt.  Total ck normal ruling out rhabdomyolysis.    Dr.  Bero assumes care.  Plan dispo home with robaxin  if negative d dimer  Amount and/or Complexity of Data Reviewed Labs: ordered.  Risk Prescription drug management.           Final Clinical Impression(s) / ED Diagnoses Final diagnoses:  Trapezius muscle strain, left, initial encounter    Rx / DC Orders ED Discharge Orders          Ordered    methocarbamol  (ROBAXIN ) 750 MG tablet  2 times daily        08/28/23 0022              Shantella Blubaugh, PA-C 08/28/23 0023    Edson Graces, MD 08/28/23 [phone]

## 2023-08-27 NOTE — ED Triage Notes (Signed)
 Pt from home complains of Left arm pain for 2 weeks, pain starts in forearm, up to shoulder, then recently started radiating to the right shoulder. Pt states that she barely able to move arm. Denies CP and SOB. Endorses working out but states that she doesn't think its from that. Pt AAOx4 and ambulatory

## 2023-08-28 ENCOUNTER — Ambulatory Visit (HOSPITAL_COMMUNITY)
Admission: RE | Admit: 2023-08-28 | Discharge: 2023-08-28 | Disposition: A | Discharge status: Home / Self Care | Source: Ambulatory Visit | Attending: Emergency Medicine | Admitting: Emergency Medicine

## 2023-08-28 DIAGNOSIS — M79602 Pain in left arm: Secondary | ICD-10-CM | POA: Insufficient documentation

## 2023-08-28 LAB — D-DIMER, QUANTITATIVE: D-Dimer, Quant: 0.78 ug{FEU}/mL — ABNORMAL HIGH (ref 0.00–0.50)

## 2023-08-28 MED ORDER — APIXABAN 5 MG PO TABS
10.0000 mg | ORAL_TABLET | Freq: Once | ORAL | Status: AC
Start: 2023-08-28 — End: 2023-08-28
  Administered 2023-08-28: 10 mg via ORAL
  Filled 2023-08-28: qty 2

## 2023-08-28 MED ORDER — METHOCARBAMOL 750 MG PO TABS: 750.0000 mg | ORAL_TABLET | Freq: Two times a day (BID) | ORAL | 0 refills | Status: DC

## 2023-08-28 NOTE — Discharge Instructions (Addendum)
 You were evaluated in the Emergency Department and after careful evaluation, we did not find any emergent condition requiring admission or further testing in the hospital.  Your exam/testing today is overall reassuring.  We recommend returning to the any pain emergency department for ultrasound of your arm to look for blood clot.  Come back later this morning.  If the ultrasound is negative your pain is likely related to muscle strain or spasm and you can use the Robaxin  muscle relaxer at home for pain.  Please return to the Emergency Department if you experience any worsening of your condition.   Thank you for allowing us  to be a part of your care.

## 2023-08-28 NOTE — ED Provider Notes (Signed)
  Provider Note MRN:  [id number]  Arrival date & time: 08/28/23    ED Course and Medical Decision Making  Assumed care of patient at sign-out or upon transfer.  Arm pain/swelling awaiting screening a D-dimer, anticipating discharge.  1 AM update: D-dimer is positive, patient does seem to have a fair amount of pain from the bicep upwards.  History of DVT as well.  Will need ultrasound, will come back in the morning.  Providing single dose of apixaban  for anticoagulation coverage until she can return.  Procedures  Final Clinical Impressions(s) / ED Diagnoses     ICD-10-CM   1. Pain of left upper extremity  M79.602       ED Discharge Orders          Ordered    methocarbamol  (ROBAXIN ) 750 MG tablet  2 times daily        08/28/23 0022    US  Venous Img Upper Uni Left        08/28/23 0103              Discharge Instructions      You were evaluated in the Emergency Department and after careful evaluation, we did not find any emergent condition requiring admission or further testing in the hospital.  Your exam/testing today is overall reassuring.  We recommend returning to the any pain emergency department for ultrasound of your arm to look for blood clot.  Come back later this morning.  If the ultrasound is negative your pain is likely related to muscle strain or spasm and you can use the Robaxin  muscle relaxer at home for pain.  Please return to the Emergency Department if you experience any worsening of your condition.   Thank you for allowing us  to be a part of your care.    Merrick Abe. Harless Lien, MD [facility] Emergency Medicine [facility] [email or web address]    Edson Graces, MD 08/28/23 [phone]

## 2023-08-28 NOTE — ED Provider Notes (Signed)
 Was here for upper extremity DVT rule out left upper extremity due to some pain history of DVT.  Ultrasound negative per radiology report.  I informed patient of her results and she is going to pick up her Robaxin  for likely muscle strain and follow-up as directed last night.  She had no further concerns.  She is well-appearing   Sara Pruitt 08/28/23 1050    [place], [place], MD 08/28/23 1134

## 2023-08-29 ENCOUNTER — Other Ambulatory Visit

## 2023-09-19 ENCOUNTER — Ambulatory Visit: Payer: BC Managed Care – PPO | Admitting: Neurology
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
## 2023-09-24 ENCOUNTER — Encounter: Payer: Self-pay | Admitting: Neurology

## 2023-09-24 ENCOUNTER — Ambulatory Visit: Payer: BC Managed Care – PPO | Admitting: Neurology

## 2023-09-24 VITALS — BP 129/71 | HR 55 | Ht 67.0 in | Wt 195.0 lb

## 2023-09-24 DIAGNOSIS — G43009 Migraine without aura, not intractable, without status migrainosus: Secondary | ICD-10-CM | POA: Diagnosis not present

## 2023-10-03 ENCOUNTER — Other Ambulatory Visit: Payer: Self-pay

## 2023-10-03 NOTE — Progress Notes (Signed)
 Specialty Pharmacy Refill Coordination Note  Sara Pruitt is a 48 y.o. female contacted today regarding refills of specialty medication(s) OnabotulinumtoxinA  (BOTOX )   Patient requested Courier to Provider Office   Delivery date: 10/13/23   Verified address: [facility] [address] [place] [address]   Medication will be filled on 06.13.25.

## 2023-10-10 ENCOUNTER — Other Ambulatory Visit: Payer: Self-pay | Admitting: Neurology

## 2023-10-10 ENCOUNTER — Other Ambulatory Visit (HOSPITAL_COMMUNITY): Payer: Self-pay

## 2023-10-10 ENCOUNTER — Other Ambulatory Visit: Payer: Self-pay

## 2023-10-10 DIAGNOSIS — G43709 Chronic migraine without aura, not intractable, without status migrainosus: Secondary | ICD-10-CM

## 2023-10-10 MED ORDER — BOTOX 200 UNITS IJ SOLR
INTRAMUSCULAR | 4 refills | Status: DC
  Filled 2023-10-10: qty 1, 34d supply, fill #0
  Filled 2024-01-13 – 2024-01-26 (×6): qty 1, 34d supply, fill #1

## 2023-10-13 ENCOUNTER — Other Ambulatory Visit: Payer: Self-pay

## 2023-10-17 ENCOUNTER — Ambulatory Visit: Admitting: Neurology

## 2023-10-17 DIAGNOSIS — G43009 Migraine without aura, not intractable, without status migrainosus: Secondary | ICD-10-CM | POA: Diagnosis not present

## 2023-10-17 MED ORDER — ONABOTULINUMTOXINA 100 UNITS IJ SOLR: 200.0000 [IU] | Freq: Once | INTRAMUSCULAR | Status: AC

## 2023-10-17 NOTE — Progress Notes (Unsigned)

## 2023-10-22 ENCOUNTER — Other Ambulatory Visit (HOSPITAL_COMMUNITY): Payer: Self-pay | Admitting: Internal Medicine

## 2023-10-22 DIAGNOSIS — Z1231 Encounter for screening mammogram for malignant neoplasm of breast: Secondary | ICD-10-CM

## 2023-10-27 ENCOUNTER — Encounter (HOSPITAL_COMMUNITY): Payer: Self-pay

## 2023-10-27 ENCOUNTER — Ambulatory Visit (HOSPITAL_COMMUNITY)
Admission: RE | Admit: 2023-10-27 | Discharge: 2023-10-27 | Disposition: A | Discharge status: Home / Self Care | Source: Ambulatory Visit | Attending: Internal Medicine | Admitting: Internal Medicine

## 2023-10-27 DIAGNOSIS — Z1231 Encounter for screening mammogram for malignant neoplasm of breast: Secondary | ICD-10-CM | POA: Insufficient documentation

## 2023-11-10 ENCOUNTER — Encounter: Payer: Self-pay | Admitting: Gastroenterology

## 2023-11-17 ENCOUNTER — Encounter: Payer: Self-pay | Admitting: Obstetrics & Gynecology

## 2023-11-17 ENCOUNTER — Ambulatory Visit: Admitting: Obstetrics & Gynecology

## 2023-11-17 ENCOUNTER — Other Ambulatory Visit (HOSPITAL_COMMUNITY)
Admission: RE | Admit: 2023-11-17 | Discharge: 2023-11-17 | Disposition: A | Discharge status: Home / Self Care | Source: Ambulatory Visit | Attending: Obstetrics & Gynecology | Admitting: Obstetrics & Gynecology

## 2023-11-17 VITALS — BP 159/101 | HR 57 | Ht 67.0 in | Wt 189.0 lb

## 2023-11-17 DIAGNOSIS — Z1151 Encounter for screening for human papillomavirus (HPV): Secondary | ICD-10-CM

## 2023-11-17 DIAGNOSIS — Z90710 Acquired absence of both cervix and uterus: Secondary | ICD-10-CM | POA: Diagnosis not present

## 2023-11-17 DIAGNOSIS — Z0000 Encounter for general adult medical examination without abnormal findings: Secondary | ICD-10-CM

## 2023-11-17 DIAGNOSIS — N3281 Overactive bladder: Secondary | ICD-10-CM | POA: Diagnosis not present

## 2023-11-17 DIAGNOSIS — Z01419 Encounter for gynecological examination (general) (routine) without abnormal findings: Secondary | ICD-10-CM

## 2023-11-17 MED ORDER — SOLIFENACIN SUCCINATE 10 MG PO TABS: 10.0000 mg | ORAL_TABLET | Freq: Every day | ORAL | 11 refills | Status: AC
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
## 2023-11-21 LAB — CYTOLOGY - PAP
Comment: NEGATIVE
Diagnosis: NEGATIVE
High risk HPV: NEGATIVE

## 2023-12-30 ENCOUNTER — Other Ambulatory Visit: Payer: Self-pay

## 2024-01-01 ENCOUNTER — Encounter: Payer: Self-pay | Admitting: Gastroenterology

## 2024-01-01 ENCOUNTER — Ambulatory Visit: Admitting: Gastroenterology

## 2024-01-01 ENCOUNTER — Telehealth: Payer: Self-pay | Admitting: Gastroenterology

## 2024-01-01 VITALS — BP 132/90 | HR 72 | Ht 67.0 in | Wt 194.0 lb

## 2024-01-01 DIAGNOSIS — R110 Nausea: Secondary | ICD-10-CM | POA: Diagnosis not present

## 2024-01-01 DIAGNOSIS — R1013 Epigastric pain: Secondary | ICD-10-CM | POA: Diagnosis not present

## 2024-01-01 DIAGNOSIS — K5909 Other constipation: Secondary | ICD-10-CM

## 2024-01-01 DIAGNOSIS — K5904 Chronic idiopathic constipation: Secondary | ICD-10-CM

## 2024-01-01 DIAGNOSIS — Z800 Family history of malignant neoplasm of digestive organs: Secondary | ICD-10-CM

## 2024-01-01 DIAGNOSIS — K219 Gastro-esophageal reflux disease without esophagitis: Secondary | ICD-10-CM

## 2024-01-01 DIAGNOSIS — R6881 Early satiety: Secondary | ICD-10-CM | POA: Diagnosis not present

## 2024-01-01 DIAGNOSIS — G8929 Other chronic pain: Secondary | ICD-10-CM

## 2024-01-01 MED ORDER — NA SULFATE-K SULFATE-MG SULF 17.5-3.13-1.6 GM/177ML PO SOLN: 1.0000 | Freq: Once | ORAL | 0 refills | Status: AC

## 2024-01-01 MED ORDER — LANSOPRAZOLE 15 MG PO CPDR: 15.0000 mg | DELAYED_RELEASE_CAPSULE | Freq: Every day | ORAL | 2 refills | Status: DC

## 2024-01-01 NOTE — Telephone Encounter (Signed)
 Pharmacy requesting call back to discuss lansoprazole  prescription.

## 2024-01-01 NOTE — Patient Instructions (Addendum)
 Recommend GERD diet Switched to Lansoprazole  15 mg po daily  Constipation Samples of linzess 145mcg, take 1 capsule 30-45 minutes before first meal of day with full glass of water . May have loose stools first few days taking medication   Low fodmap diet  We have sent the following medications to your pharmacy for you to pick up at your convenience: SUPREP  You have been scheduled for an endoscopy and colonoscopy. Please follow the written instructions given to you at your visit today.  If you use inhalers (even only as needed), please bring them with you on the day of your procedure.  DO NOT TAKE 7 DAYS PRIOR TO TEST- Trulicity (dulaglutide) Ozempic, Wegovy (semaglutide) Mounjaro (tirzepatide) Bydureon Bcise (exanatide extended release)  DO NOT TAKE 1 DAY PRIOR TO YOUR TEST Rybelsus (semaglutide) Adlyxin (lixisenatide) Victoza (liraglutide) Byetta (exanatide) ___________________________________________________________________________ Due to recent changes in healthcare laws, you may see the results of your imaging and laboratory studies on MyChart before your provider has had a chance to review them.  We understand that in some cases there may be results that are confusing or concerning to you. Not all laboratory results come back in the same time frame and the provider may be waiting for multiple results in order to interpret others.  Please give us  48 hours in order for your provider to thoroughly review all the results before contacting the office for clarification of your results.   _______________________________________________________  If your blood pressure at your visit was 140/90 or greater, please contact your primary care physician to follow up on this.  _______________________________________________________  If you are age 87 or older, your body mass index should be between 23-30. Your Body mass index is 30.38 kg/m. If this is out of the aforementioned range listed,  please consider follow up with your Primary Care Provider.  If you are age 77 or younger, your body mass index should be between 19-25. Your Body mass index is 30.38 kg/m. If this is out of the aformentioned range listed, please consider follow up with your Primary Care Provider.   ________________________________________________________  The [facility] providers would like to encourage you to use MYCHART to communicate with providers for non-urgent requests or questions.  Due to long hold times on the telephone, sending your provider a message by [facility] may be a faster and more efficient way to get a response.  Please allow 48 business hours for a response.  Please remember that this is for non-urgent requests.  _______________________________________________________  [facility] is using a team-based approach to care.  Your team is made up of your doctor and two to three APPS. Our APPS (Nurse Practitioners and Physician Assistants) work with your physician to ensure care continuity for you. They are fully qualified to address your health concerns and develop a treatment plan. They communicate directly with your gastroenterologist to care for you. Seeing the Advanced Practice Practitioners on your physician's team can help you by facilitating care more promptly, often allowing for earlier appointments, access to diagnostic testing, procedures, and other specialty referrals.   Thank you for trusting me with your gastrointestinal care. Deanna May, FNP-C
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
## 2024-01-07 ENCOUNTER — Other Ambulatory Visit (HOSPITAL_COMMUNITY): Payer: Self-pay

## 2024-01-07 ENCOUNTER — Telehealth: Payer: Self-pay | Admitting: Pharmacy Technician

## 2024-01-07 ENCOUNTER — Telehealth: Payer: Self-pay

## 2024-01-07 NOTE — Telephone Encounter (Signed)
 PA needed for Ubrelvy.

## 2024-01-07 NOTE — Telephone Encounter (Signed)
 PA has been submitted, and telephone encounter has been created. Please see telephone encounter dated 9.10.25.

## 2024-01-07 NOTE — Telephone Encounter (Signed)
 Pharmacy Patient Advocate Encounter   Received notification from Pt Calls Messages that prior authorization for UBRELVY  100MG  is required/requested.   Insurance verification completed.   The patient is insured through [employer] .   Per test claim: PA required; PA submitted to above mentioned insurance via Prompt PA Key/confirmation #/EOC [id number] Status is pending

## 2024-01-08 ENCOUNTER — Other Ambulatory Visit (HOSPITAL_COMMUNITY): Payer: Self-pay

## 2024-01-08 NOTE — Telephone Encounter (Signed)
 Pharmacy Patient Advocate Encounter  Received notification from [employer] that Prior Authorization for UBRELVY  100MG  has been APPROVED from 9.10.25 to 9.11.26. Ran test claim, Copay is $0. This test claim was processed through [facility]- copay amounts may vary at other pharmacies due to pharmacy/plan contracts, or as the patient moves through the different stages of their insurance plan.   PA #/Case ID/Reference #: [id number]

## 2024-01-12 MED ORDER — LINACLOTIDE 145 MCG PO CAPS: 145.0000 ug | ORAL_CAPSULE | Freq: Every day | ORAL | 2 refills | Status: DC

## 2024-01-13 ENCOUNTER — Telehealth: Payer: Self-pay | Admitting: Pharmacy Technician

## 2024-01-13 ENCOUNTER — Other Ambulatory Visit: Payer: Self-pay

## 2024-01-13 ENCOUNTER — Other Ambulatory Visit (HOSPITAL_COMMUNITY): Payer: Self-pay

## 2024-01-13 NOTE — Progress Notes (Signed)
 Benefits Investigation Started  Rejection: Coverage Terminated - 10/27/23  Routed to: Rx Prior Auth Team (ATTN: [place])

## 2024-01-13 NOTE — Telephone Encounter (Signed)
 Pharmacy Patient Advocate Encounter   Received notification from Patient Pharmacy that prior authorization for BOTOX  200 is required/requested.   Insurance verification completed.   The patient is insured through [employer] .   Per test claim: PA required; PA submitted to above mentioned insurance via Latent Key/confirmation #/EOC [facility] Status is pending

## 2024-01-13 NOTE — Progress Notes (Signed)
 Pt has different insurance. New request for Botox  has been submitted.

## 2024-01-14 ENCOUNTER — Other Ambulatory Visit (HOSPITAL_COMMUNITY): Payer: Self-pay

## 2024-01-16 ENCOUNTER — Ambulatory Visit: Admitting: Neurology

## 2024-01-16 ENCOUNTER — Other Ambulatory Visit: Payer: Self-pay

## 2024-01-19 ENCOUNTER — Other Ambulatory Visit: Payer: Self-pay

## 2024-01-19 ENCOUNTER — Other Ambulatory Visit (HOSPITAL_COMMUNITY): Payer: Self-pay

## 2024-01-20 ENCOUNTER — Other Ambulatory Visit: Payer: Self-pay

## 2024-01-21 ENCOUNTER — Other Ambulatory Visit (HOSPITAL_COMMUNITY): Payer: Self-pay

## 2024-01-21 NOTE — Telephone Encounter (Signed)
 LMOVM for patient to call the office back.    Advised of medication's in the same class as Botox .  Aimovig,Emgality,Ajovy,Qulipta.

## 2024-01-21 NOTE — Telephone Encounter (Signed)
 Not covered under medical or pharmacy benefits

## 2024-01-21 NOTE — Telephone Encounter (Signed)
 Pharmacy Patient Advocate Encounter  Received notification from [employer] that Prior Authorization for BOTOX  has been DENIED.  Full denial letter will be uploaded to the media tab. See denial reason below.

## 2024-01-21 NOTE — Telephone Encounter (Signed)
 BotoxOne is pending [id number]

## 2024-01-22 NOTE — Telephone Encounter (Signed)
 Patient advised,  She has already been on Aimovig. Consider Emgality, Ajovy, Qulipta or Nurtec every other day (preventative).   Patient will think about it and  call us  back.

## 2024-01-23 ENCOUNTER — Ambulatory Visit: Admitting: Neurology

## 2024-01-23 ENCOUNTER — Other Ambulatory Visit: Payer: Self-pay

## 2024-01-26 ENCOUNTER — Other Ambulatory Visit: Payer: Self-pay

## 2024-01-28 HISTORY — PX: COLONOSCOPY WITH ESOPHAGOGASTRODUODENOSCOPY (EGD): SHX5779

## 2024-02-10 ENCOUNTER — Other Ambulatory Visit: Payer: Self-pay

## 2024-02-12 ENCOUNTER — Other Ambulatory Visit (HOSPITAL_COMMUNITY): Payer: Self-pay

## 2024-02-12 ENCOUNTER — Telehealth: Payer: Self-pay | Admitting: Pharmacy Technician

## 2024-02-12 ENCOUNTER — Other Ambulatory Visit: Payer: Self-pay

## 2024-02-12 NOTE — Telephone Encounter (Signed)
 PA has been submitted, and telephone encounter has been created. Please see telephone encounter dated 10.16.25.

## 2024-02-12 NOTE — Telephone Encounter (Signed)
 Pharmacy Patient Advocate Encounter   Received notification from Pt Calls Messages that prior authorization for AJOVY 225MG  is required/requested.   Insurance verification completed.   The patient is insured through [employer].   Per test claim: PA required; PA submitted to above mentioned insurance via Prompt PA Key/confirmation #/EOC [id number] Status is pending

## 2024-02-13 ENCOUNTER — Encounter: Payer: Self-pay | Admitting: Pediatrics

## 2024-02-13 ENCOUNTER — Other Ambulatory Visit (HOSPITAL_COMMUNITY): Payer: Self-pay

## 2024-02-16 ENCOUNTER — Other Ambulatory Visit (HOSPITAL_COMMUNITY): Payer: Self-pay

## 2024-02-16 NOTE — Telephone Encounter (Signed)
 Pharmacy Patient Advocate Encounter  Received notification from [employer] that Prior Authorization for AJOVY 225MG  has been APPROVED from 10.16.25 to 10.17.26. Ran test claim, Copay is $24.98. This test claim was processed through [facility]- copay amounts may vary at other pharmacies due to pharmacy/plan contracts, or as the patient moves through the different stages of their insurance plan.   PA #/Case ID/Reference #: [id number]
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
## 2024-02-20 ENCOUNTER — Encounter: Payer: Self-pay | Admitting: Pediatrics

## 2024-02-20 ENCOUNTER — Ambulatory Visit: Admitting: Pediatrics

## 2024-02-20 VITALS — BP 145/84 | HR 68 | Temp 97.7°F | Resp 11 | Ht 67.0 in | Wt 194.0 lb

## 2024-02-20 DIAGNOSIS — K6289 Other specified diseases of anus and rectum: Secondary | ICD-10-CM | POA: Diagnosis not present

## 2024-02-20 DIAGNOSIS — K2950 Unspecified chronic gastritis without bleeding: Secondary | ICD-10-CM

## 2024-02-20 DIAGNOSIS — K5730 Diverticulosis of large intestine without perforation or abscess without bleeding: Secondary | ICD-10-CM

## 2024-02-20 DIAGNOSIS — K648 Other hemorrhoids: Secondary | ICD-10-CM

## 2024-02-20 DIAGNOSIS — K219 Gastro-esophageal reflux disease without esophagitis: Secondary | ICD-10-CM | POA: Diagnosis not present

## 2024-02-20 DIAGNOSIS — Z800 Family history of malignant neoplasm of digestive organs: Secondary | ICD-10-CM | POA: Diagnosis not present

## 2024-02-20 DIAGNOSIS — R1310 Dysphagia, unspecified: Secondary | ICD-10-CM | POA: Diagnosis not present

## 2024-02-20 DIAGNOSIS — Z1211 Encounter for screening for malignant neoplasm of colon: Secondary | ICD-10-CM | POA: Diagnosis present

## 2024-02-20 DIAGNOSIS — R110 Nausea: Secondary | ICD-10-CM

## 2024-02-20 DIAGNOSIS — K5900 Constipation, unspecified: Secondary | ICD-10-CM

## 2024-02-20 DIAGNOSIS — R6881 Early satiety: Secondary | ICD-10-CM

## 2024-02-20 DIAGNOSIS — R1013 Epigastric pain: Secondary | ICD-10-CM

## 2024-02-20 MED ORDER — SODIUM CHLORIDE 0.9 % IV SOLN: 500.0000 mL | Freq: Once | INTRAVENOUS | Status: AC

## 2024-02-20 NOTE — Patient Instructions (Addendum)
 YOU HAD AN ENDOSCOPIC PROCEDURE TODAY AT THE [facility]:   Refer to the procedure report that was given to you for any specific questions about what was found during the examination.  If the procedure report does not answer your questions, please call your gastroenterologist to clarify.  If you requested that your care partner not be given the details of your procedure findings, then the procedure report has been included in a sealed envelope for you to review at your convenience later.  YOU SHOULD EXPECT: Some feelings of bloating in the abdomen. Passage of more gas than usual.  Walking can help get rid of the air that was put into your GI tract during the procedure and reduce the bloating. If you had a lower endoscopy (such as a colonoscopy or flexible sigmoidoscopy) you may notice spotting of blood in your stool or on the toilet paper. If you underwent a bowel prep for your procedure, you may not have a normal bowel movement for a few days.  Please Note:  You might notice some irritation and congestion in your nose or some drainage.  This is from the oxygen used during your procedure.  There is no need for concern and it should clear up in a day or so.  SYMPTOMS TO REPORT IMMEDIATELY:  Following lower endoscopy (colonoscopy or flexible sigmoidoscopy):  Excessive amounts of blood in the stool  Significant tenderness or worsening of abdominal pains  Swelling of the abdomen that is new, acute  Fever of 100F or higher  Following upper endoscopy (EGD)  Vomiting of blood or coffee ground material  New chest pain or pain under the shoulder blades  Painful or persistently difficult swallowing  New shortness of breath  Fever of 100F or higher  Black, tarry-looking stools  For urgent or emergent issues, a gastroenterologist can be reached at any hour by calling [phone]. Do not use MyChart messaging for urgent concerns.    DIET:  We do recommend a small meal at first, but  then you may proceed to your regular diet.  Drink plenty of fluids but you should avoid alcoholic beverages for 24 hours.  MEDICATIONS: Continue present medications.  FOLLOW UP: Await pathology results. Repeat colonoscopy in 5 years for screening purposes given family history of colon cancer in a first degree relative.  Educational materials given to patient: Diverticulosis, Hemorrhoids.  Thank you for allowing us  to provide for your healthcare needs today.  ACTIVITY:  You should plan to take it easy for the rest of today and you should NOT DRIVE or use heavy machinery until tomorrow (because of the sedation medicines used during the test).    FOLLOW UP: Our staff will call the number listed on your records the next business day following your procedure.  We will call around 7:15- 8:00 am to check on you and address any questions or concerns that you may have regarding the information given to you following your procedure. If we do not reach you, we will leave a message.     If any biopsies were taken you will be contacted by phone or by letter within the next 1-3 weeks.  Please call us  at [phone] if you have not heard about the biopsies in 3 weeks.    SIGNATURES/CONFIDENTIALITY: You and/or your care partner have signed paperwork which will be entered into your electronic medical record.  These signatures attest to the fact that that the information above on your After Visit Summary has been  reviewed and is understood.  Full responsibility of the confidentiality of this discharge information lies with you and/or your care-partner.

## 2024-02-20 NOTE — Progress Notes (Signed)
 Report to PACU, RN, vss, BBS= Clear.

## 2024-02-20 NOTE — Op Note (Signed)
 [facility] Patient Name: Sara Pruitt Procedure Date: 02/20/2024 8:38 AM MRN: [id number] Endoscopist: Inocente Hausen , MD, [id number] Age: 48 Referring MD:  Date of Birth: 1975/12/31 Gender: Female Account #: [id number] Procedure:                Colonoscopy Indications:              Screening in patient at increased risk: Family                            history of 1st-degree relative with colorectal                            cancer before age 56 years, Last colonoscopy: 2018,                            Incidental constipation noted Medicines:                Monitored Anesthesia Care Procedure:                Pre-Anesthesia Assessment:                           - Prior to the procedure, a History and Physical                            was performed, and patient medications and                            allergies were reviewed. The patient's tolerance of                            previous anesthesia was also reviewed. The risks                            and benefits of the procedure and the sedation                            options and risks were discussed with the patient.                            All questions were answered, and informed consent                            was obtained. Prior Anticoagulants: The patient has                            taken no anticoagulant or antiplatelet agents. ASA                            Grade Assessment: III - A patient with severe                            systemic disease. After reviewing the risks and  benefits, the patient was deemed in satisfactory                            condition to undergo the procedure.                           After obtaining informed consent, the colonoscope                            was passed under direct vision. Throughout the                            procedure, the patient's blood pressure, pulse, and                            oxygen saturations were  monitored continuously. The                            Olympus CF-[id number] ([id number]) Colonoscope was                            introduced through the anus and advanced to the                            cecum, identified by appendiceal orifice and                            ileocecal valve. The colonoscopy was performed                            without difficulty. The patient tolerated the                            procedure well. The quality of the bowel                            preparation was adequate. The ileocecal valve,                            appendiceal orifice, and rectum were photographed. Scope In: 9:08:21 AM Scope Out: 9:23:06 AM Scope Withdrawal Time: 0 hours 10 minutes 17 seconds  Total Procedure Duration: 0 hours 14 minutes 45 seconds  Findings:                 The perianal examination was normal.                           The digital rectal exam findings include decreased                            sphincter tone. Pertinent negatives include no                            palpable rectal lesions.  A few small-mouthed diverticula were found in the                            ascending colon.                           The colon (entire examined portion) appeared normal.                           Internal hemorrhoids were found during retroflexion. Complications:            No immediate complications. Estimated blood loss:                            Minimal. Estimated Blood Loss:     Estimated blood loss was minimal. Impression:               - Decreased sphincter tone found on digital rectal                            exam.                           - Diverticulosis in the ascending colon.                           - The entire examined colon is normal.                           - Internal hemorrhoids.                           - No specimens collected. Recommendation:           - Discharge patient to home (ambulatory).                            - Repeat colonoscopy in 5 years for screening                            purposes given family history of colon cancer in a                            first degree relative.                           - The findings and recommendations were discussed                            with the patient.                           - Patient has a contact number available for                            emergencies. The signs and symptoms of potential  delayed complications were discussed with the                            patient. Return to normal activities tomorrow.                            Written discharge instructions were provided to the                            patient. Inocente Hausen, MD 02/20/2024 9:27:59 AM This report has been signed electronically.

## 2024-02-20 NOTE — Progress Notes (Signed)
 Called to room to assist during endoscopic procedure.  Patient ID and intended procedure confirmed with present staff. Received instructions for my participation in the procedure from the performing physician.

## 2024-02-20 NOTE — Op Note (Addendum)
 [facility] Patient Name: Sara Pruitt Procedure Date: 02/20/2024 8:43 AM MRN: [id number] Endoscopist: Inocente Hausen , MD, [id number] Age: 48 Referring MD:  Date of Birth: [birth year] Gender: Female Account #: [id number] Procedure:                Upper GI endoscopy Indications:              Follow-up of gastro-esophageal reflux disease,                            Dyspepsia, Dysphagia, Early satiety, Nausea Medicines:                Monitored Anesthesia Care Procedure:                Pre-Anesthesia Assessment:                           - Prior to the procedure, a History and Physical                            was performed, and patient medications and                            allergies were reviewed. The patient's tolerance of                            previous anesthesia was also reviewed. The risks                            and benefits of the procedure and the sedation                            options and risks were discussed with the patient.                            All questions were answered, and informed consent                            was obtained. Prior Anticoagulants: The patient has                            taken no anticoagulant or antiplatelet agents. ASA                            Grade Assessment: III - A patient with severe                            systemic disease. After reviewing the risks and                            benefits, the patient was deemed in satisfactory                            condition to undergo the procedure.  After obtaining informed consent, the endoscope was                            passed under direct vision. Throughout the                            procedure, the patient's blood pressure, pulse, and                            oxygen saturations were monitored continuously. The                            Olympus Scope [id number] was introduced through the                             mouth, and advanced to the second part of duodenum.                            The upper GI endoscopy was accomplished without                            difficulty. The patient tolerated the procedure                            well. Scope In: Scope Out: Findings:                 No endoscopic abnormality was evident in the                            esophagus to explain the patient's complaint of                            dysphagia. It was decided, however, to proceed with                            dilation of the entire esophagus. A guidewire was                            placed and the scope was withdrawn. Dilation was                            performed with a Savary dilator with no resistance                            at 18 mm. Biopsies were obtained from the proximal                            and distal esophagus with cold forceps for                            histology evaluation of eosinophilic esophagitis.  The gastric body, gastric antrum, cardia (on                            retroflexion) and gastric fundus (on retroflexion)                            were normal. Biopsies were taken with a cold                            forceps for Helicobacter pylori testing.                           The duodenal bulb and second portion of the                            duodenum were normal. Biopsies for histology were                            taken with a cold forceps for evaluation of celiac                            disease. Complications:            No immediate complications. Estimated blood loss:                            Minimal. Estimated Blood Loss:     Estimated blood loss was minimal. Impression:               - No endoscopic esophageal abnormality to explain                            patient's dysphagia. Esophagus dilated. Dilated.                           - Normal gastric body, antrum, cardia and gastric                             fundus. Biopsied.                           - Normal duodenal bulb and second portion of the                            duodenum. Biopsied.                           - Biopsies were taken with a cold forceps for                            evaluation of eosinophilic esophagitis. Recommendation:           - Await pathology results.                           - Perform a colonoscopy today.                           -  The findings and recommendations were discussed                            with the patient's family. Inocente Hausen, MD 02/20/2024 9:06:57 AM This report has been signed electronically.

## 2024-02-22 ENCOUNTER — Telehealth: Payer: Self-pay | Admitting: Gastroenterology

## 2024-02-22 NOTE — Telephone Encounter (Signed)
 Received page to on call. Patient with new onset rectal pain following colonoscopy on 10/24. No prior similar sxs. Colo report reviewed and small internal hemorrhoids but o/w normal perianal exam. She reports rectal pain and difficulty sitting for the last 24 hours. Discussed ddx- possibly thrombosed external hemorrhoid and recommend trying Recticare complete and sitz bath. Please call to check in on her tomorrow. If ongoing sxs, may need to consider exam and/or surgical referral for eval and treatment.

## 2024-02-23 ENCOUNTER — Ambulatory Visit (INDEPENDENT_AMBULATORY_CARE_PROVIDER_SITE_OTHER): Admitting: Physician Assistant

## 2024-02-23 ENCOUNTER — Telehealth: Payer: Self-pay

## 2024-02-23 ENCOUNTER — Encounter: Payer: Self-pay | Admitting: Physician Assistant

## 2024-02-23 VITALS — BP 104/68 | HR 89 | Ht 67.0 in | Wt 197.5 lb

## 2024-02-23 DIAGNOSIS — K644 Residual hemorrhoidal skin tags: Secondary | ICD-10-CM | POA: Diagnosis not present

## 2024-02-23 DIAGNOSIS — K602 Anal fissure, unspecified: Secondary | ICD-10-CM

## 2024-02-23 DIAGNOSIS — Z800 Family history of malignant neoplasm of digestive organs: Secondary | ICD-10-CM

## 2024-02-23 DIAGNOSIS — K581 Irritable bowel syndrome with constipation: Secondary | ICD-10-CM | POA: Diagnosis not present

## 2024-02-23 MED ORDER — AMBULATORY NON FORMULARY MEDICATION: 1 refills | Status: AC
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
## 2024-02-23 NOTE — Telephone Encounter (Signed)
  Follow up Call-     02/20/2024    8:09 AM  Call back number  Post procedure Call Back phone  # [phone]  Permission to leave phone message Yes     Patient questions:  Do you have a fever, pain , or abdominal swelling? No. Pain Score  0 *  Have you tolerated food without any problems? Yes.    Have you been able to return to your normal activities? Yes.    Do you have any questions about your discharge instructions: Diet   No. Medications  No. Follow up visit  No.  Do you have questions or concerns about your Care? No.  Actions: * If pain score is 4 or above: No action needed, pain <4.

## 2024-02-23 NOTE — Telephone Encounter (Signed)
 Called & spoke with patient. Patient reports continued rectal pain. Denies rectal bleeding, itching, or burning. Last BM was Saturday and it was liquid. Patient reports that she has not had a solid stool yet. Patient tried Recticare twice since yesterday, minimal improvement. Patient rates pain 9/10 currently. Patient is getting ready to take Tylenol . Patient reports that her rectum is sore to touch. I informed patient that she will likely need to be evaluated in office to determine if it is a thrombosed hemorrhoid or not. If it is, that will require surgical evaluation. Advised that I will see what MD recommends at this time.

## 2024-02-23 NOTE — Patient Instructions (Addendum)
 We have sent a prescription for Diltiazem/ Lidocaine  gel to [facility]. Apply a pea size amount 1/2 to 1 inch inside rectum 3-4 times daily x1 month.  [facility]'s information is below: Address: [address] [place], [place] [address]  [phone]  *Please DO NOT go directly from our office to pick up this medication! Give the pharmacy 1 day to process the prescription as this is compounded and takes time to make.  - Start Diltiazem/ Lidocaine  gel to [facility]. Apply a pea size amount 1/2 to 1 inch inside rectum 3-4 times daily  for 1 month. - Recommend warm water  sitz bath with Epsom salt 20 minutes 2-3 times daily. - Start OTC Colace stool softener 100mg  once daily to prevent any hard stools. - If needed, also take OTC Miralax  powder mix 1 capful in a drink daily for constipation. - Use fragrance free wet wipes instead of dry tissue. - You may also use OTC Witch Hazel Wipes, Tucks pads, and Preparation for the small external hemorrhoid. - Follow-up visit in 4 to 6 weeks to reevaluate symptoms. - Surgery is a last resort if fissure does not heal with conservative treatment.   Please follow up sooner if symptoms increase or worsen  Due to recent changes in healthcare laws, you may see the results of your imaging and laboratory studies on MyChart before your provider has had a chance to review them.  We understand that in some cases there may be results that are confusing or concerning to you. Not all laboratory results come back in the same time frame and the provider may be waiting for multiple results in order to interpret others.  Please give us  48 hours in order for your provider to thoroughly review all the results before contacting the office for clarification of your results.   Thank you for trusting me with your gastrointestinal care!   Ellouise Console, PA-C _______________________________________________________  If your blood pressure at your  visit was 140/90 or greater, please contact your primary care physician to follow up on this.  _______________________________________________________  If you are age 46 or older, your body mass index should be between 23-30. Your Body mass index is 30.93 kg/m. If this is out of the aforementioned range listed, please consider follow up with your Primary Care Provider.  If you are age 46 or younger, your body mass index should be between 19-25. Your Body mass index is 30.93 kg/m. If this is out of the aformentioned range listed, please consider follow up with your Primary Care Provider.   ________________________________________________________  The [facility] providers would like to encourage you to use MYCHART to communicate with providers for non-urgent requests or questions.  Due to long hold times on the telephone, sending your provider a message by [facility] may be a faster and more efficient way to get a response.  Please allow 48 business hours for a response.  Please remember that this is for non-urgent requests.  _______________________________________________________

## 2024-02-23 NOTE — Telephone Encounter (Signed)
 Called & spoke with patient. Patient has been scheduled for a follow up with Ellouise, PA this afternoon at 3 pm. Patient knows to check in on 3rd floor.   Heavenly, CMA made aware of same-day add on appt.

## 2024-02-25 ENCOUNTER — Ambulatory Visit: Payer: Self-pay | Admitting: Pediatrics

## 2024-02-25 LAB — SURGICAL PATHOLOGY

## 2024-03-20 ENCOUNTER — Other Ambulatory Visit: Payer: Self-pay | Admitting: Neurology

## 2024-03-22 ENCOUNTER — Other Ambulatory Visit (HOSPITAL_COMMUNITY): Payer: Self-pay

## 2024-03-23 ENCOUNTER — Other Ambulatory Visit: Payer: Self-pay | Admitting: Neurology

## 2024-03-23 ENCOUNTER — Telehealth: Payer: Self-pay | Admitting: Neurology

## 2024-03-23 MED ORDER — AJOVY 225 MG/1.5ML ~~LOC~~ SOAJ: 225.0000 mg | SUBCUTANEOUS | 5 refills | Status: AC

## 2024-03-23 NOTE — Telephone Encounter (Signed)
 Patient called and states that she has been waiting on the medication Ajovy  for a month now and she would like to know the status of the approval from the ins and please send in a rx to the [employer]  if it has been a approved

## 2024-03-23 NOTE — Telephone Encounter (Signed)
 Pt called informed that ajovy  has been sent
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
## 2024-04-05 ENCOUNTER — Encounter: Payer: Self-pay | Admitting: Neurology

## 2024-04-05 ENCOUNTER — Ambulatory Visit: Admitting: Neurology

## 2024-04-05 VITALS — BP 120/80 | HR 63 | Ht 67.0 in | Wt 198.6 lb

## 2024-04-05 DIAGNOSIS — G43009 Migraine without aura, not intractable, without status migrainosus: Secondary | ICD-10-CM

## 2024-04-05 DIAGNOSIS — Z8669 Personal history of other diseases of the nervous system and sense organs: Secondary | ICD-10-CM | POA: Diagnosis not present

## 2024-04-05 NOTE — Patient Instructions (Signed)
 Ajovy  -  if no improvement in 3 months, contact me Get repeat eye exam and have them send note to me. Ubrelvy  and Zofran  as needed

## 2024-04-06 ENCOUNTER — Other Ambulatory Visit (HOSPITAL_COMMUNITY): Payer: Self-pay

## 2024-04-06 ENCOUNTER — Other Ambulatory Visit: Payer: Self-pay

## 2024-04-06 ENCOUNTER — Other Ambulatory Visit: Payer: Self-pay | Admitting: Pharmacy Technician

## 2024-04-06 ENCOUNTER — Emergency Department (HOSPITAL_COMMUNITY)
Admission: EM | Admit: 2024-04-06 | Discharge: 2024-04-06 | Disposition: A | Discharge status: Home / Self Care | Attending: Emergency Medicine | Admitting: Emergency Medicine

## 2024-04-06 ENCOUNTER — Encounter (HOSPITAL_COMMUNITY): Payer: Self-pay | Admitting: Emergency Medicine

## 2024-04-06 ENCOUNTER — Emergency Department (HOSPITAL_COMMUNITY)

## 2024-04-06 DIAGNOSIS — I10 Essential (primary) hypertension: Secondary | ICD-10-CM

## 2024-04-06 DIAGNOSIS — M79605 Pain in left leg: Secondary | ICD-10-CM

## 2024-04-06 NOTE — Progress Notes (Signed)
 Disenrolled; therapy changed from Botox  to Ajovy . Checked with Monchell B.

## 2024-04-06 NOTE — ED Notes (Signed)
 Ultrasound at bedside

## 2024-04-06 NOTE — ED Triage Notes (Signed)
 Pt c.o of left knee pain. States the pain is in the back of her knee and she fears she has a blood clot. No hx of blood clots.

## 2024-04-06 NOTE — Discharge Instructions (Addendum)
 You were seen in the emergency department for left leg pain.  Your ultrasound did not show any evidence of blood clot.  This is likely muscular pain.  Please use Tylenol  and ibuprofen, ice to the area.  Follow-up with your regular doctor.  Return to the emergency department if any worsening or concerning symptoms   Providers Accepting New Patients in [place], [place]    [facility] [address]  [place], [place] [address] [phone] Accepts most insurances  [facility] [address] [place], [place] [address] [phone] Accepts most insurances  [facility] [address] [place], [place] [address]  [phone] Must meet requirements  [facility] [address] [place], [place] [address] [phone] Accepts most insurances  [facility] [address]  [place], [place] [address] [phone] Accepts most insurances  [facility] [address]   [place], [place]   [phone] Accepts most insurances  [facility])  [phone] [address]  [place], [place] [address] [phone] Accepts most insurances     [facility] [address]  [place], [place] [address] [phone] Accepts most insurances  [facility] [address] [place], [place] [address] [phone] option 1 Accepts Medicaid and [facility] [address]  [place], [place] [address] [phone] Accepts most insurances  Benita Outhouse, MD [address] [place], [place] [address] [phone] Accepts most insurances   [facility] [address]  [place], [place] [address] [phone] Accepts most insurances  [facility] [phone] [address] [place], [place] [address] [phone] Accepts most insurances  [place] [place] [address] [place],  [place] [address] [phone]  Accepts most insurances

## 2024-04-06 NOTE — ED Provider Notes (Signed)
 [place] EMERGENCY DEPARTMENT AT [facility] Provider Note   CSN: [id number] Arrival date & time: 04/06/24  9172     Patient presents with: Knee Pain   Sara Pruitt is a 48 y.o. female.  He has a prior history of DVT on her right leg thought to be provoked from the surgery.  Complaining of pain behind her left knee started last evening.  No known trauma.  No chest pain or shortness of breath.  No numbness or weakness.   The history is provided by the patient.  Knee Pain Location:  Knee Time since incident:  1 day Injury: no   Knee location:  L knee Pain details:    Quality:  Aching   Onset quality:  Gradual   Timing:  Constant   Progression:  Unchanged Chronicity:  New Worsened by:  Activity Associated symptoms: no fever, no numbness, no swelling and no tingling        Prior to Admission medications   Medication Sig Start Date End Date Taking? Authorizing Provider  acetaminophen  (TYLENOL  8 HOUR) 650 MG CR tablet     [provider]  albuterol (PROVENTIL) 2 MG tablet Take 2 mg by mouth 3 (three) times daily. 11/28/22   [provider]  albuterol (VENTOLIN HFA) 108 (90 Base) MCG/ACT inhaler Inhale 2 puffs into the lungs every 4 (four) hours as needed. 07/30/22   [provider]  AMBULATORY NON FORMULARY MEDICATION Medication Name: Diltiazem 2%/ Lidocaine  5% Apply pea size amount 1/2 to 1 inch inside rectum 3-4 times daily x1 month. 02/23/24   [place], PA-C  Bacillus Coagulans-Inulin (PROBIOTIC) 1-250 BILLION-MG CAPS     [provider]  Flaxseed, Linseed, (FLAX SEED OIL PO) Take by mouth.    [provider]  Fremanezumab -vfrm (AJOVY ) 225 MG/1.5ML SOAJ Inject 225 mg into the skin every 28 (twenty-eight) days. 03/23/24   Skeet Juliene SAUNDERS, DO  lansoprazole  (PREVACID ) 15 MG capsule Take 1 capsule (15 mg total) by mouth daily at 12 noon. 01/01/24   May, Deanna J, NP  levothyroxine  (SYNTHROID ) 25 MCG tablet Take 25 mcg  by mouth every morning. 10/27/23   [provider]  linaclotide  (LINZESS ) 145 MCG CAPS capsule Take 1 capsule (145 mcg total) by mouth daily before breakfast. 01/12/24   May, Deanna J, NP  Misc Natural Products (FIBER 7 PO)     [provider]  Multiple Vitamin (MULTIVITAMIN WITH MINERALS) TABS tablet Take 1 tablet by mouth daily.    [provider]  pantoprazole  (PROTONIX ) 40 MG tablet Take 1 tablet (40 mg total) by mouth daily. 07/18/22   Silver Wonda LABOR, PA  solifenacin  (VESICARE ) 10 MG tablet Take 1 tablet (10 mg total) by mouth daily. 11/17/23   Jayne Vonn DEL, MD  Ubrogepant  (UBRELVY ) 100 MG TABS Take 1 tablet (100 mg total) by mouth as needed. May repeat after 2 hours. Maximum 2 tablets in 24 hours. 03/22/24   Skeet Juliene SAUNDERS, DO  vitamin C (ASCORBIC ACID) 500 MG tablet Take 1,000 mg by mouth daily.    [provider]  VITAMIN D PO Take by mouth.    [provider]    Allergies: Sulfa antibiotics, Sulfonamide derivatives, and Strawberry extract    Review of Systems  Constitutional:  Negative for fever.    Updated Vital Signs BP (!) 161/98 (BP Location: Left Arm)   Pulse 65   Temp 98.2 F (36.8 C) (Oral)   Resp 18   Ht  5' 7 (1.702 m)   Wt 89.8 kg   SpO2 100%   BMI 31.01 kg/m   Physical Exam Constitutional:      Appearance: Normal appearance. She is well-developed.  HENT:     Head: Normocephalic and atraumatic.  Eyes:     Conjunctiva/sclera: Conjunctivae normal.  Cardiovascular:     Rate and Rhythm: Normal rate and regular rhythm.  Pulmonary:     Effort: Pulmonary effort is normal.     Breath sounds: Normal breath sounds.  Musculoskeletal:        General: Tenderness present. No deformity. Normal range of motion.     Cervical back: Neck supple.     Comments: She has tenderness behind her left knee and through her calf muscle.  No cords appreciated.  Distal pulses motor and sensation intact.  No significant swelling.  No direct  bony tenderness.  Skin:    General: Skin is warm and dry.  Neurological:     General: No focal deficit present.     Mental Status: She is alert.     GCS: GCS eye subscore is 4. GCS verbal subscore is 5. GCS motor subscore is 6.     Sensory: No sensory deficit.     Motor: No weakness.     (all labs ordered are listed, but only abnormal results are displayed) Labs Reviewed - No data to display  EKG: None  Radiology: US  Venous Img Lower  Left (DVT Study) Result Date: 04/06/2024 CLINICAL DATA:  leg pain.  X3 days EXAM: LEFT LOWER EXTREMITY VENOUS DOPPLER ULTRASOUND TECHNIQUE: Gray-scale sonography with compression, as well as color and duplex ultrasound, were performed to evaluate the deep venous system(s) from the level of the common femoral vein through the popliteal and proximal calf veins. COMPARISON:  Contralateral RIGHT lower extremity venous duplex, 12/12/2020. CT AP, 10/20/2018. FINDINGS: VENOUS Normal compressibility of the common femoral, superficial femoral, and popliteal veins, as well as the visualized calf veins. Visualized portions of profunda femoral vein and great saphenous vein unremarkable. No filling defects to suggest DVT on grayscale or color Doppler imaging. Doppler waveforms show normal direction of venous flow, normal respiratory plasticity and response to augmentation. Limited views of the contralateral common femoral vein are unremarkable. OTHER No evidence of superficial thrombophlebitis or abnormal fluid collection. Limitations: none IMPRESSION: No evidence of femoropopliteal DVT or superficial thrombophlebitis within the LEFT lower extremity. Thom Hall, MD [facility] Electronically Signed   By: Thom Hall M.D.   On: 04/06/2024 10:26     Procedures   Medications Ordered in the ED - No data to display  Clinical Course as of 04/06/24 1717  Tue Apr 06, 2024  1039 Duplex negative for acute.  I reviewed  with patient.  She is comfortable plan for discharge.  Return instructions discussed. [MB]    Clinical Course User Index [MB] Towana Ozell BROCKS, MD                                 Medical Decision Making  This patient complains of left knee and leg pain; this involves an extensive number of treatment Options and is a complaint that carries with it a high risk of complications and morbidity. The differential includes musculoskeletal pain, cellulitis, DVT I ordered imaging studies which included duplex and I independently    visualized and interpreted imaging which showed no acute findings Social determinants considered,  no significant barriers Critical Interventions: None  After the interventions stated above, I reevaluated the patient and found patient to be neurovascularly intact and well-appearing Admission and further testing considered, no indications for admission at this time.  Recommended symptomatic treatment and outpatient follow-up with PCP.  Blood pressure elevated here, likely related to pain.  This will need to be followed up with her PCP also.  Return instructions discussed      Final diagnoses:  Left leg pain  Hypertension, unspecified type    ED Discharge Orders     None          Towana Ozell BROCKS, MD 04/06/24 1718

## 2024-04-08 ENCOUNTER — Ambulatory Visit (INDEPENDENT_AMBULATORY_CARE_PROVIDER_SITE_OTHER): Admitting: Gastroenterology

## 2024-04-08 ENCOUNTER — Ambulatory Visit
Admission: EM | Admit: 2024-04-08 | Discharge: 2024-04-08 | Disposition: A | Discharge status: Home / Self Care | Attending: Nurse Practitioner | Admitting: Nurse Practitioner

## 2024-04-08 ENCOUNTER — Encounter: Payer: Self-pay | Admitting: Gastroenterology

## 2024-04-08 VITALS — BP 138/80 | HR 90 | Ht 67.0 in | Wt 195.0 lb

## 2024-04-08 DIAGNOSIS — J0140 Acute pansinusitis, unspecified: Secondary | ICD-10-CM

## 2024-04-08 DIAGNOSIS — Z800 Family history of malignant neoplasm of digestive organs: Secondary | ICD-10-CM

## 2024-04-08 DIAGNOSIS — K644 Residual hemorrhoidal skin tags: Secondary | ICD-10-CM

## 2024-04-08 DIAGNOSIS — K602 Anal fissure, unspecified: Secondary | ICD-10-CM

## 2024-04-08 DIAGNOSIS — K5904 Chronic idiopathic constipation: Secondary | ICD-10-CM

## 2024-04-08 DIAGNOSIS — K580 Irritable bowel syndrome with diarrhea: Secondary | ICD-10-CM | POA: Diagnosis not present

## 2024-04-08 MED ORDER — AMOXICILLIN-POT CLAVULANATE 875-125 MG PO TABS: 1.0000 | ORAL_TABLET | Freq: Two times a day (BID) | ORAL | 0 refills | Status: AC

## 2024-04-08 MED ORDER — AZELASTINE HCL 0.1 % NA SOLN: 2.0000 | Freq: Two times a day (BID) | NASAL | 0 refills | Status: AC

## 2024-04-08 MED ORDER — LINACLOTIDE 72 MCG PO CAPS: 72.0000 ug | ORAL_CAPSULE | Freq: Every day | ORAL | 3 refills | Status: AC

## 2024-04-08 MED ORDER — PROMETHAZINE-DM 6.25-15 MG/5ML PO SYRP: 5.0000 mL | ORAL_SOLUTION | Freq: Four times a day (QID) | ORAL | 0 refills | Status: AC | PRN

## 2024-04-08 NOTE — ED Triage Notes (Signed)
 Pt reports cough, congestion, sinus drainage x 3 weeks. Pt has tried several OTC meds but has found no relief.
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
## 2024-04-08 NOTE — Patient Instructions (Addendum)
 Constipation We have reduced your Linzess  dosage to 72mcg po daily Please let me know via Mychart if this dosge does not work. Continue Benefiber daily Recommend high-fiber diet, 30 g of fiber daily Eat fruits, vegetables, and whole grains Drink 64 ounces of water  / fluids daily.  Hemorrhoids - Warm water  sitz bath with epsom salt for flare up of external hemorrhoids. - Use OTC Preparation H, Tucks Pads, and Witch Hazel wipes as needed. -continue regimen for constipation as above   _______________________________________________________  If your blood pressure at your visit was 140/90 or greater, please contact your primary care physician to follow up on this.  _______________________________________________________  If you are age 58 or older, your body mass index should be between 23-30. Your Body mass index is 30.54 kg/m. If this is out of the aforementioned range listed, please consider follow up with your Primary Care Provider.  If you are age 67 or younger, your body mass index should be between 19-25. Your Body mass index is 30.54 kg/m. If this is out of the aformentioned range listed, please consider follow up with your Primary Care Provider.   ________________________________________________________  The [facility] providers would like to encourage you to use MYCHART to communicate with providers for non-urgent requests or questions.  Due to long hold times on the telephone, sending your provider a message by [facility] may be a faster and more efficient way to get a response.  Please allow 48 business hours for a response.  Please remember that this is for non-urgent requests.  _______________________________________________________  [facility] is using a team-based approach to care.  Your team is made up of your doctor and two to three APPS. Our APPS (Nurse Practitioners and Physician Assistants) work with your physician to ensure care continuity for you. They are  fully qualified to address your health concerns and develop a treatment plan. They communicate directly with your gastroenterologist to care for you. Seeing the Advanced Practice Practitioners on your physician's team can help you by facilitating care more promptly, often allowing for earlier appointments, access to diagnostic testing, procedures, and other specialty referrals.   Thank you for trusting me with your gastrointestinal care. Deanna May, FNP-C

## 2024-04-08 NOTE — Discharge Instructions (Signed)
 Take medication as directed. Increase fluids and get plenty of rest. You may take over-the-counter ibuprofen or Tylenol  as needed for pain, fever, or general discomfort. Recommend normal saline nasal spray to help with nasal congestion throughout the day. For your cough, it may be helpful to use a humidifier at bedtime during sleep. If symptoms fail to improve with this treatment, you may follow-up in this clinic or with your primary care physician for further evaluation. Follow-up as needed.
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
## 2024-04-09 ENCOUNTER — Other Ambulatory Visit: Payer: Self-pay | Admitting: Gastroenterology

## 2024-04-10 ENCOUNTER — Other Ambulatory Visit: Payer: Self-pay | Admitting: Gastroenterology

## 2024-04-10 DIAGNOSIS — K219 Gastro-esophageal reflux disease without esophagitis: Secondary | ICD-10-CM

## 2024-04-23 ENCOUNTER — Ambulatory Visit: Admitting: Neurology

## 2024-05-03 DIAGNOSIS — K219 Gastro-esophageal reflux disease without esophagitis: Secondary | ICD-10-CM

## 2024-05-13 ENCOUNTER — Other Ambulatory Visit (HOSPITAL_COMMUNITY): Payer: Self-pay

## 2024-05-13 ENCOUNTER — Telehealth: Payer: Self-pay

## 2024-05-13 NOTE — Telephone Encounter (Signed)
 Pharmacy Patient Advocate Encounter   Received notification from CoverMyMeds that prior authorization for Lansoprazole  15MG  dr capsules is required/requested.   Insurance verification completed.   The patient is insured through [employer].   Per test claim: PA required; PA submitted to above mentioned insurance via Latent Key/confirmation #/EOC [id number] Status is pending

## 2024-06-08 ENCOUNTER — Ambulatory Visit: Admitting: Dermatology

## 2024-07-23 ENCOUNTER — Ambulatory Visit: Payer: Self-pay
